# Patient Record
Sex: Female | Born: 1980 | Race: Black or African American | Hispanic: No | Marital: Married | Smoking: Never smoker
Health system: Southern US, Community
[De-identification: ages and names within clinical notes are randomized; demographics above are authoritative.]

## PROBLEM LIST (undated history)

## (undated) DIAGNOSIS — K5900 Constipation, unspecified: Secondary | ICD-10-CM

## (undated) DIAGNOSIS — J310 Chronic rhinitis: Secondary | ICD-10-CM

## (undated) DIAGNOSIS — Z8619 Personal history of other infectious and parasitic diseases: Secondary | ICD-10-CM

## (undated) DIAGNOSIS — Z98891 History of uterine scar from previous surgery: Secondary | ICD-10-CM

## (undated) DIAGNOSIS — N979 Female infertility, unspecified: Secondary | ICD-10-CM

## (undated) DIAGNOSIS — J050 Acute obstructive laryngitis [croup]: Secondary | ICD-10-CM

## (undated) HISTORY — PX: WISDOM TOOTH EXTRACTION: SHX21

## (undated) HISTORY — DX: Personal history of other infectious and parasitic diseases: Z86.19

## (undated) HISTORY — DX: Female infertility, unspecified: N97.9

---
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
## 2001-06-21 ENCOUNTER — Other Ambulatory Visit: Admission: RE | Admit: 2001-06-21 | Discharge: 2001-06-21 | Payer: Self-pay | Admitting: Family Medicine

## 2002-02-11 ENCOUNTER — Encounter: Admission: RE | Admit: 2002-02-11 | Discharge: 2002-02-11 | Payer: Self-pay | Admitting: *Deleted

## 2005-02-25 ENCOUNTER — Emergency Department (HOSPITAL_COMMUNITY): Admission: EM | Admit: 2005-02-25 | Discharge: 2005-02-26 | Payer: Self-pay | Admitting: Emergency Medicine

## 2006-11-17 IMAGING — CT CT PELVIS W/ CM
2 of 5 series · 17 of 46 positions shown, 19 images · IV contrast (APPLIED)
Comparison: None

ABDOMEN CT WITH CONTRAST

CLINICAL DATA: Right lower quadrant pain
TECHNIQUE: Multidetector CT imaging of the abdomen and pelvis was performed
following the standard protocol during bolus administration of intravenous
contrast.

Contrast:  100 cc Omnipaque 300

[Series 2: abd/pelv with 5.0 b31f st · axial · 0.64mm/px · z∈[-464,-64]mm · 14 of 92 slices shown, 16 images]
[im 6/92  soft-tissue]
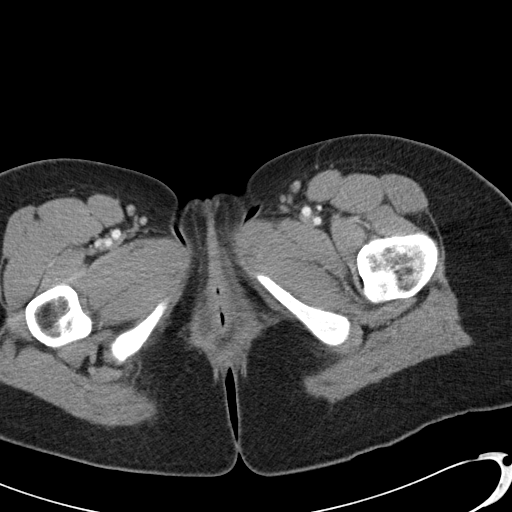
[im 6/92  bone]
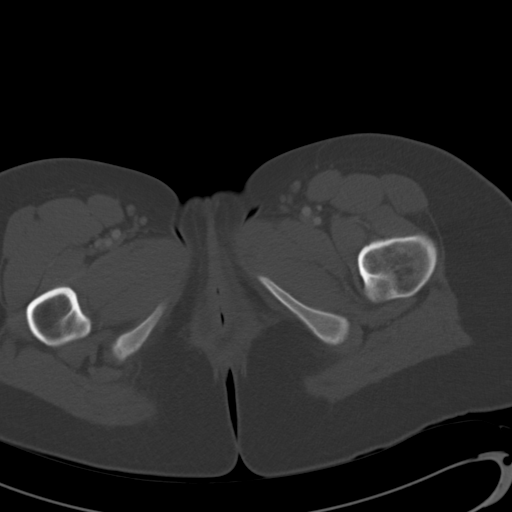
[im 11/92  soft-tissue]
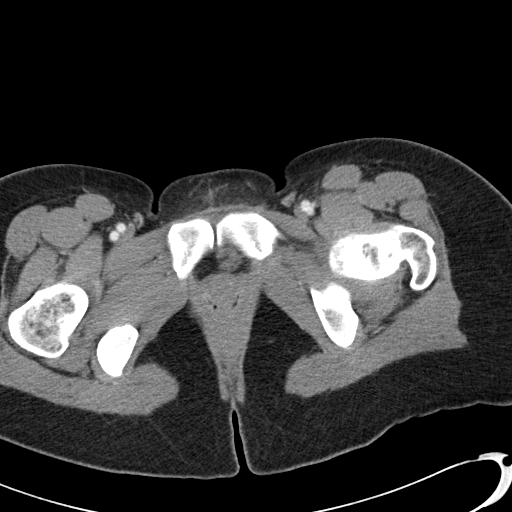
[im 21/92  soft-tissue]
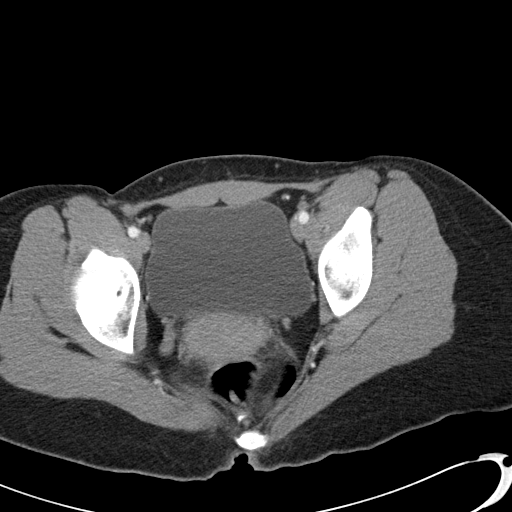
[im 26/92  soft-tissue]
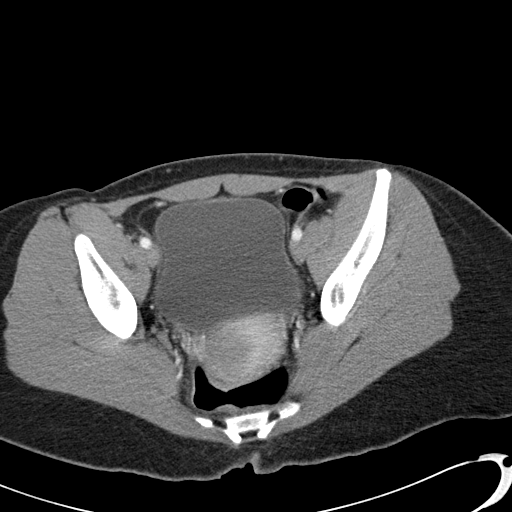
[im 31/92  soft-tissue]
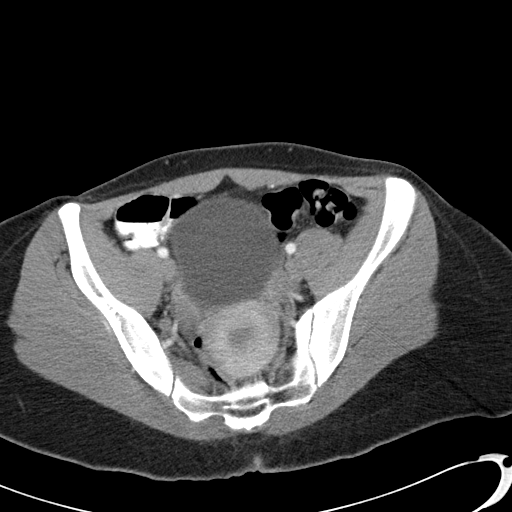
[im 36/92  soft-tissue]
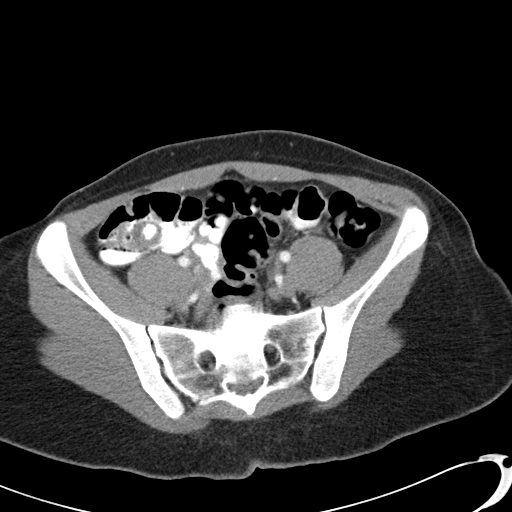
[im 41/92  soft-tissue]
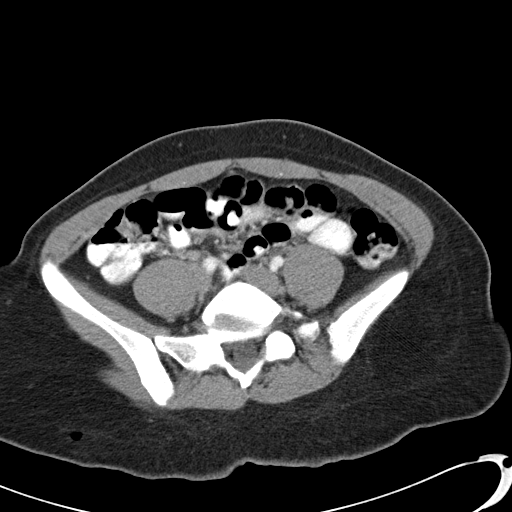
[im 51/92  soft-tissue]
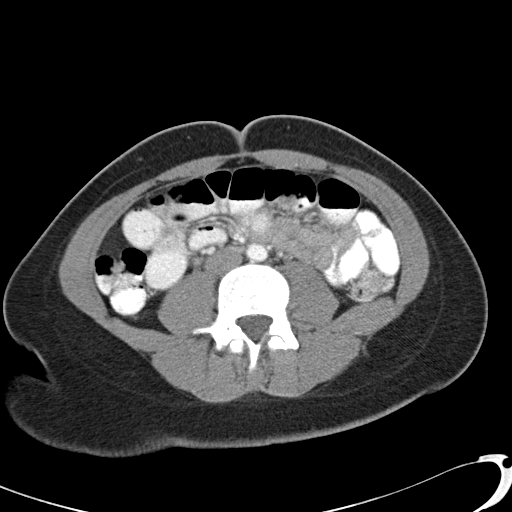
[im 56/92  soft-tissue]
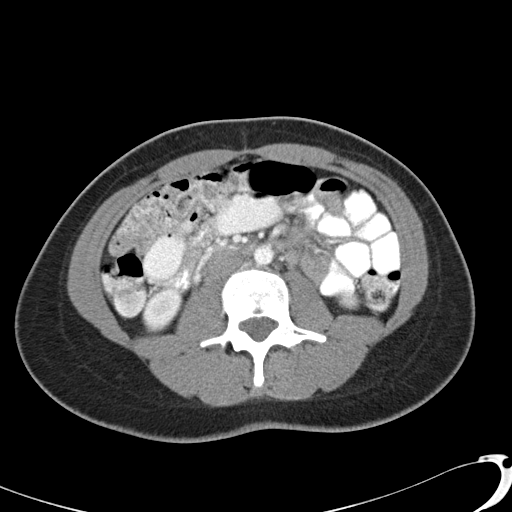
[im 56/92  bone]
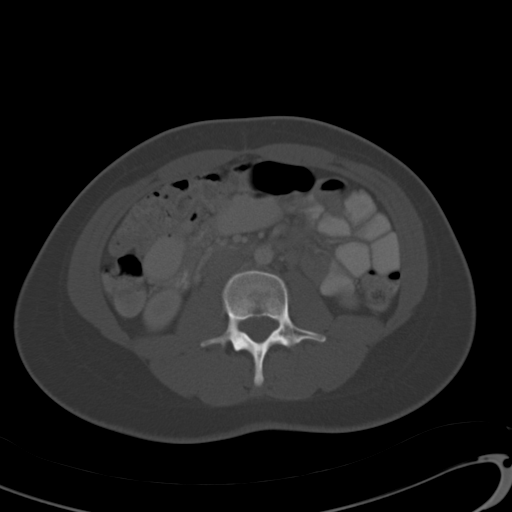
[im 61/92  soft-tissue]
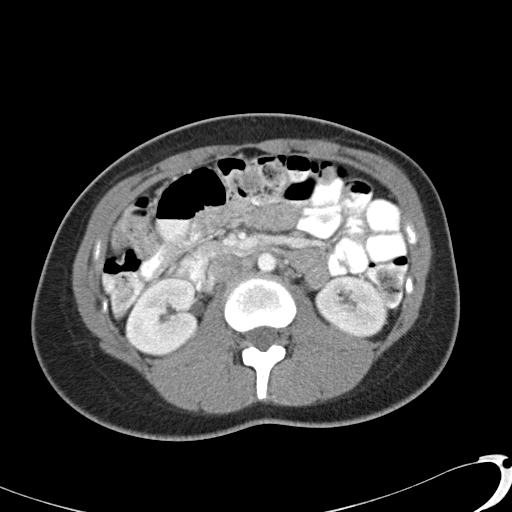
[im 66/92  soft-tissue]
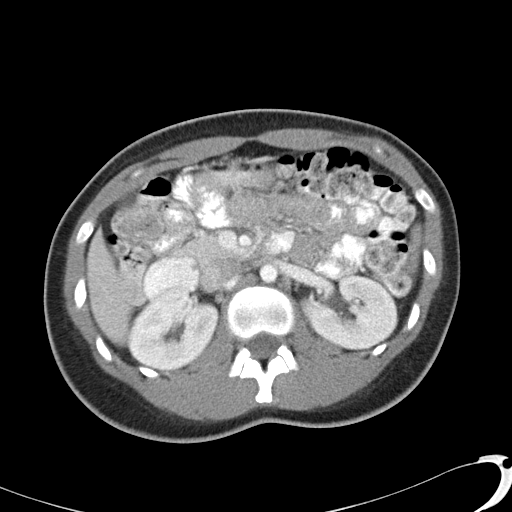
[im 71/92  soft-tissue]
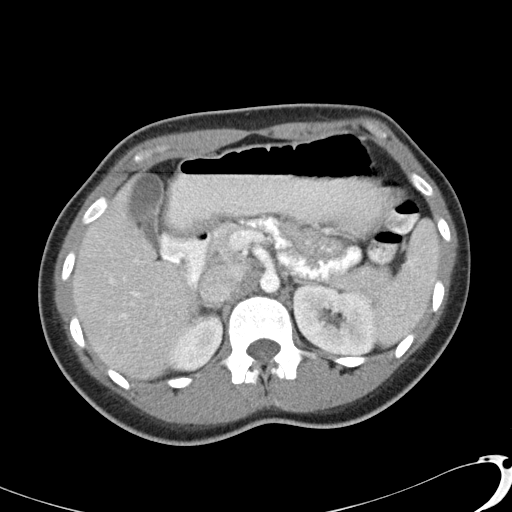
[im 81/92  soft-tissue]
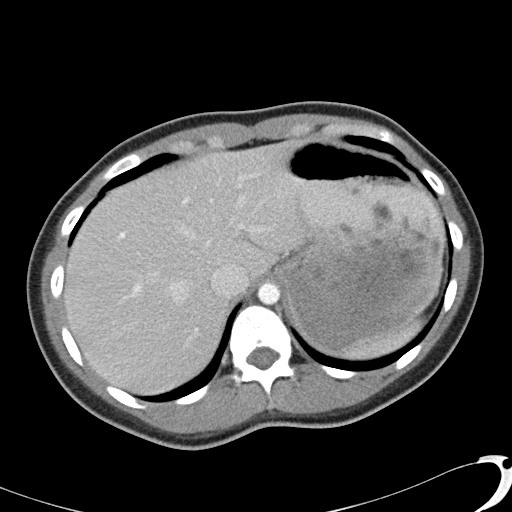
[im 86/92  soft-tissue]
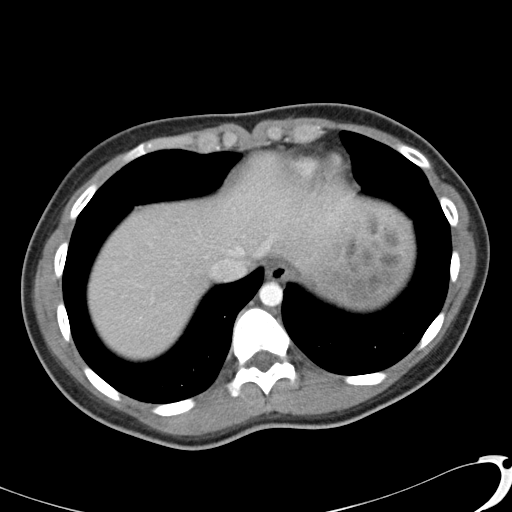

[Series 4: abd/pelv with 2.0 spo st · coronal · 0.89mm/px · 3 of 93 slices shown]
[im 31/93  soft-tissue]
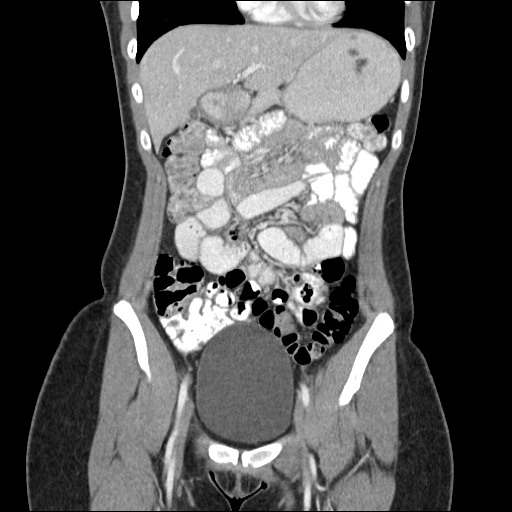
[im 41/93  soft-tissue]
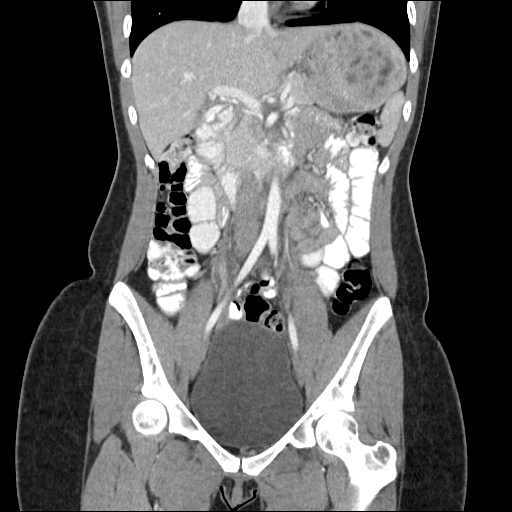
[im 52/93  soft-tissue]
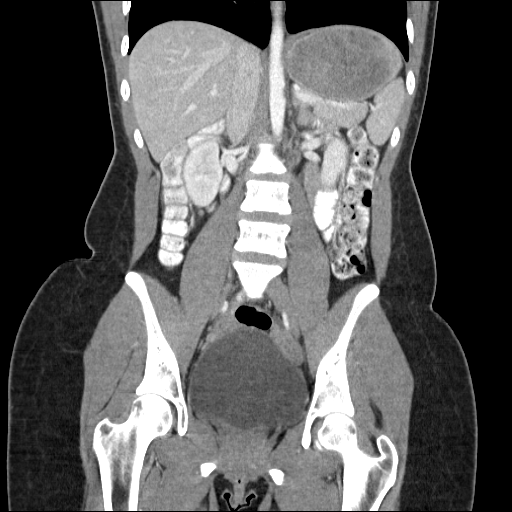

[17 of 46 positions shown; findings below may reference images not displayed]

FINDINGS: Liver, spleen, pancreas, adrenals, kidneys unremarkable. No free
fluid, free air, or adenopathy. Bowel grossly unremarkable.

IMPRESSION

No acute findings.

PELVIS CT WITH CONTRAST
FINDINGS: Appendix is difficult to visualize on the axial images. However, on
the coronal reconstructed images, it appears that this passes medially from the
low lying cecum with the tip lying near the midline. This is filled with
contrast and is normal.

2.1 cm cystic area within the right adnexa, likely right ovarian cyst. Small
amount of free fluid. Small amount of fluid within the endometrium. Left ovary
unremarkable.

IMPRESSION

Appendix difficult to visualize on the axial images, but is seen on the coronal
reconstructed images near the midline and is normal.

2.1 cm right adnexal cyst. Small amount of free fluid.

## 2008-04-02 ENCOUNTER — Encounter: Payer: Self-pay | Admitting: Family Medicine

## 2008-04-02 ENCOUNTER — Ambulatory Visit: Payer: Self-pay | Admitting: Family Medicine

## 2008-04-02 ENCOUNTER — Other Ambulatory Visit: Admission: RE | Admit: 2008-04-02 | Discharge: 2008-04-02 | Payer: Self-pay | Admitting: Family Medicine

## 2008-04-02 DIAGNOSIS — N760 Acute vaginitis: Secondary | ICD-10-CM | POA: Insufficient documentation

## 2008-04-02 LAB — CONVERTED CEMR LAB
Beta hcg, urine, semiquantitative: NEGATIVE
KOH Prep: NEGATIVE

## 2008-04-10 ENCOUNTER — Encounter (INDEPENDENT_AMBULATORY_CARE_PROVIDER_SITE_OTHER): Payer: Self-pay | Admitting: *Deleted

## 2008-05-02 ENCOUNTER — Ambulatory Visit: Payer: Self-pay | Admitting: Family Medicine

## 2008-05-02 DIAGNOSIS — N390 Urinary tract infection, site not specified: Secondary | ICD-10-CM | POA: Insufficient documentation

## 2008-05-02 DIAGNOSIS — J309 Allergic rhinitis, unspecified: Secondary | ICD-10-CM | POA: Insufficient documentation

## 2008-05-02 LAB — CONVERTED CEMR LAB
Bilirubin Urine: NEGATIVE
Glucose, Urine, Semiquant: NEGATIVE
Ketones, urine, test strip: NEGATIVE
Nitrite: NEGATIVE
Protein, U semiquant: NEGATIVE
Specific Gravity, Urine: 1.015
Urobilinogen, UA: 0.2
pH: 6

## 2008-05-03 ENCOUNTER — Encounter: Payer: Self-pay | Admitting: Family Medicine

## 2008-08-12 ENCOUNTER — Ambulatory Visit: Payer: Self-pay | Admitting: Family Medicine

## 2008-08-12 DIAGNOSIS — J019 Acute sinusitis, unspecified: Secondary | ICD-10-CM

## 2008-08-25 ENCOUNTER — Ambulatory Visit: Payer: Self-pay | Admitting: Family Medicine

## 2008-08-25 DIAGNOSIS — N926 Irregular menstruation, unspecified: Secondary | ICD-10-CM

## 2008-08-25 LAB — CONVERTED CEMR LAB: Beta hcg, urine, semiquantitative: NEGATIVE

## 2008-11-14 ENCOUNTER — Ambulatory Visit: Payer: Self-pay | Admitting: Internal Medicine

## 2008-11-28 ENCOUNTER — Encounter (INDEPENDENT_AMBULATORY_CARE_PROVIDER_SITE_OTHER): Payer: Self-pay | Admitting: *Deleted

## 2008-11-28 ENCOUNTER — Ambulatory Visit: Payer: Self-pay | Admitting: Family Medicine

## 2009-04-15 ENCOUNTER — Telehealth (INDEPENDENT_AMBULATORY_CARE_PROVIDER_SITE_OTHER): Payer: Self-pay | Admitting: *Deleted

## 2009-04-20 ENCOUNTER — Telehealth (INDEPENDENT_AMBULATORY_CARE_PROVIDER_SITE_OTHER): Payer: Self-pay | Admitting: *Deleted

## 2009-05-15 ENCOUNTER — Ambulatory Visit: Payer: Self-pay | Admitting: Family

## 2009-05-15 ENCOUNTER — Other Ambulatory Visit: Admission: RE | Admit: 2009-05-15 | Discharge: 2009-05-15 | Payer: Self-pay | Admitting: Family Medicine

## 2009-05-22 ENCOUNTER — Ambulatory Visit: Payer: Self-pay | Admitting: Family

## 2009-05-22 LAB — CONVERTED CEMR LAB
BUN: 6 mg/dL
Basophils Absolute: 0 K/uL
Basophils Relative: 0.6 %
CO2: 26 meq/L
Calcium: 9.2 mg/dL
Chloride: 107 meq/L
Cholesterol: 138 mg/dL
Creatinine, Ser: 0.9 mg/dL
Eosinophils Absolute: 0.1 K/uL
Eosinophils Relative: 1.9 %
GFR calc non Af Amer: 95.82 mL/min
Glucose, Bld: 82 mg/dL
HCT: 35.4 % — ABNORMAL LOW
HDL: 70.1 mg/dL
Hemoglobin: 12.1 g/dL
LDL Cholesterol: 52 mg/dL
Lymphocytes Relative: 37.6 %
Lymphs Abs: 1.8 K/uL
MCHC: 34.1 g/dL
MCV: 99.1 fL
Monocytes Absolute: 0.3 K/uL
Monocytes Relative: 6.4 %
Neutro Abs: 2.7 K/uL
Neutrophils Relative %: 53.5 %
Platelets: 218 K/uL
Potassium: 4 meq/L
RBC: 3.57 M/uL — ABNORMAL LOW
RDW: 11.8 %
Sodium: 139 meq/L
Total CHOL/HDL Ratio: 2
Triglycerides: 78 mg/dL
VLDL: 15.6 mg/dL
WBC: 4.9 10*3/microliter

## 2009-05-29 ENCOUNTER — Encounter: Payer: Self-pay | Admitting: Family

## 2009-06-01 ENCOUNTER — Telehealth: Payer: Self-pay | Admitting: Family

## 2009-06-10 ENCOUNTER — Telehealth (INDEPENDENT_AMBULATORY_CARE_PROVIDER_SITE_OTHER): Payer: Self-pay | Admitting: *Deleted

## 2010-02-23 ENCOUNTER — Ambulatory Visit: Payer: Self-pay | Admitting: Family Medicine

## 2010-02-23 DIAGNOSIS — R5383 Other fatigue: Secondary | ICD-10-CM

## 2010-02-23 DIAGNOSIS — R5381 Other malaise: Secondary | ICD-10-CM

## 2010-02-23 LAB — CONVERTED CEMR LAB: Beta hcg, urine, semiquantitative: NEGATIVE

## 2010-02-24 LAB — CONVERTED CEMR LAB: TSH: 0.88 microintl units/mL (ref 0.35–5.50)

## 2010-03-11 ENCOUNTER — Telehealth (INDEPENDENT_AMBULATORY_CARE_PROVIDER_SITE_OTHER): Payer: Self-pay | Admitting: *Deleted

## 2010-05-25 NOTE — Letter (Signed)
   [facility] [address] [place], [place] [address] [phone]    May 22, 2009   Angela Gill [address] [place], [place] [address]  RE:  LAB RESULTS  Dear  Ms. Gill,  The following is an interpretation of your most recent lab tests.  Please take note of any instructions provided or changes to medications that have resulted from your lab work.  Pap Smear: normal   Sincerely Yours,    Lemont Fillers FNP

## 2010-05-25 NOTE — Progress Notes (Signed)
Summary: STD Results  Phone Note Call from Patient Call back at [phone]   Caller: Patient Call For: Sandford Craze Summary of Call: Patient is requesting results on the STD tests that was ran on the 21st of January. Initial call taken by: Barnie Mort,  June 01, 2009 4:52 PM  Follow-up for Phone Call        called patient and informed her that her G/C chlamydia tests were negative Follow-up by: Lemont Fillers FNP,  June 01, 2009 4:58 PM

## 2010-05-25 NOTE — Assessment & Plan Note (Signed)
Summary: PERIOD FOR 3 WKS/RH.........Marland Kitchen   Vital Signs:  Patient profile:   30 year old female Height:      65.25 inches Weight:      162 pounds BMI:     26.85 Pulse rate:   96 / minute BP sitting:   104 / 66  (right arm)  Vitals Entered By: Doristine Devoid CMA (February 23, 2010 8:53 AM) CC: period for 3 wks was taking atb for uti and has had period since    History of Present Illness: 30 yo woman here today for irregular menses.  started bleeding 1 week ago 10/25.  started w/ spotting, last 3 days have been heavy like a regular period.  LMP 10/15.  was taking abx for UTI on 10/19- Cipro for 3 days.  pt reports increased stress.  stopped pills on 10/28.  thyroid issues- mom w/ hypothyroid, sister recently dx'd w/ hyperthyroid.  was told to have this checked b/c it could be genetic.  also having some fatigue but is having a hard time separating this from normal stress/work issues.  Current Medications (verified): 1)  Sprintec 28 0.25-35 Mg-Mcg Tabs (Norgestimate-Eth Estradiol) .... Take Daily As Directed  Allergies (verified): No Known Drug Allergies  Review of Systems      See HPI  Physical Exam  General:  Young AA female, awake, alert, NAD Neck:  No deformities, masses, or tenderness noted.   Impression & Recommendations:  Problem # 1:  IRREGULAR MENSTRUAL CYCLE (ICD-626.4) Assessment Unchanged  pt's initial spotting was likely due to abx interference w/ hormones.  then when pt stopped the pills she had her period.  advised her to start a new pill pack on Sunday which will be like starting over. Her updated medication list for this problem includes:    Sprintec 28 0.25-35 Mg-mcg Tabs (Norgestimate-eth estradiol) .Marland Kitchen... Take daily as directed  Orders: Urine Pregnancy Test  (29562)  Problem # 2:  FATIGUE (ICD-780.79) Assessment: New family hx of thyroid disorder.  no record of TSH.  will check today. Orders: Venipuncture (13086) TLB-TSH (Thyroid Stimulating Hormone)  (84443-TSH)  Complete Medication List: 1)  Sprintec 28 0.25-35 Mg-mcg Tabs (Norgestimate-eth estradiol) .... Take daily as directed  Patient Instructions: 1)  Schedule your complete physical for January- do not eat before this appt 2)  Throw away the rest of your pill pack and start a new pack on Sunday 3)  We'll notify you of your thyroid results 4)  Call with any questions or concerns 5)  Have a great holiday season!!   Orders Added: 1)  Venipuncture [36415] 2)  TLB-TSH (Thyroid Stimulating Hormone) [84443-TSH] 3)  Urine Pregnancy Test  [81025] 4)  Est. Patient Level III [57846]    Laboratory Results   Urine Tests      Urine HCG: negative

## 2010-05-25 NOTE — Assessment & Plan Note (Signed)
Summary: PAP AND LAB//PH//PT RESCD//CCM   Vital Signs:  Patient profile:   30 year old female Height:      65.25 inches Weight:      158.6 pounds BMI:     26.29 Temp:     98.2 degrees F oral Pulse rate:   76 / minute Resp:     14 per minute BP sitting:   100 / 66  (left arm) Cuff size:   large  Vitals Entered By: Shonna Chock (May 15, 2009 10:46 AM) CC: CPX-? Fasting labs (patient had pears/fruit), pap Comments REVIEWED MED LIST, PATIENT AGREED DOSE AND INSTRUCTION CORRECT   Flu Vaccine Consent Questions     Do you have a history of severe allergic reactions to this vaccine? no    Any prior history of allergic reactions to egg and/or gelatin? no    Do you have a sensitivity to the preservative Thimersol? no    Do you have a past history of Guillan-Barre Syndrome? no    Do you currently have an acute febrile illness? no    Have you ever had a severe reaction to latex? no    Vaccine information given and explained to patient? yes    Are you currently pregnant? no    Lot Number:[id number]   Exp Date:10/22/2009   Site Given  Left Deltoid IM    CC:  CPX-? Fasting labs (patient had pears/fruit) and pap.  History of Present Illness: Angela Gill is a 30 year old female who presents today for a complete physical including PAP.  Needs refill on her OCP.  Preventative- exercises regularly,  eats a healthy diet.  Wants flu shot.  HA's- notes HA's are bad at night.  Notes that she has had history of migraines.  Has noted that headaches are located on the right side of her head, pounding in nature. These HA's last about 5 minutes and resolve on their own.  Has headaches that have been intermittent x 1 month. Patient notes that headache is not the worst headache of their life.    Allergies (verified): No Known Drug Allergies  Family History: CAD-no HTN-no DM-no STROKE-no COLON CA-no BREAST CA-no  Mom- Thyroidectomy Dad-  Healthy 1 sister- alive and well no children  Review  of Systems       Constitutional: Denies Fever ENT:  Denies nasal congestion or sore throat. Resp: Denies cough CV:  Denies Chest Pain or SOB GI:  Denies nausea or vomitting GU: Denies dysuria Lymphatic: Denies lymphadenopathy Musculoskeletal:  Denies muscle/joint pain Skin:  Denies Rashes Psychiatric: Denies depression, + anxiety which she feels is associated with her job Neuro: Denies numbness, weakness or blurred vision    Physical Exam  General:  Young AA female, awake, alert, NAD Head:  Normocephalic and atraumatic without obvious abnormalities. No apparent alopecia or balding. Eyes:  PERRLA Ears:  External ear exam shows no significant lesions or deformities.  Otoscopic examination reveals clear canals, tympanic membranes are intact bilaterally without bulging, retraction, inflammation or discharge. Hearing is grossly normal bilaterally. Mouth:  Oral mucosa and oropharynx without lesions or exudates.  Teeth in good repair. Neck:  No deformities, masses, or tenderness noted. Breasts:  No mass, nodules, thickening, tenderness, bulging, retraction, inflamation, nipple discharge or skin changes noted.   Lungs:  Normal respiratory effort, chest expands symmetrically. Lungs are clear to auscultation, no crackles or wheezes. Heart:  Normal rate and regular rhythm. S1 and S2 normal without gallop, murmur, click, rub or other extra sounds.  Abdomen:  Bowel sounds positive,abdomen soft and non-tender without masses, organomegaly or hernias noted. Genitalia:  Pelvic Exam:        External: normal female genitalia without lesions or masses        Vagina: normal without lesions or masses        Cervix: normal without lesions or masses        Adnexa: normal bimanual exam without masses or fullness        Uterus: normal by palpation        Pap smear: performed Msk:  No deformity or scoliosis noted of thoracic or lumbar spine.   Neurologic:  alert & oriented X3, strength normal in all  extremities, and DTRs symmetrical and normal.   Skin:  Intact without suspicious lesions or rashes Cervical Nodes:  No lymphadenopathy noted Axillary Nodes:  No palpable lymphadenopathy Psych:  Oriented X3.     Impression & Recommendations:  Problem # 1:  Preventive Health Care (ICD-V70.0) Assessment Comment Only Patient maintains a healthy diet and exercise- encouraged patient to continue the good work.  Immunizations reviewed, flu shot today. Plan to have patient return fasting for lab work.   Problem # 2:  CONTRACEPTIVE MANAGEMENT (ICD-V25.09) Assessment: Comment Only Plan refill OCP today, patient is a non-smoker  Problem # 3:  SCREENING FOR MALIGNANT NEOPLASM OF THE CERVIX (ICD-V76.2) Assessment: Comment Only Performed PAP today  Complete Medication List: 1)  Sprintec 28 0.25-35 Mg-mcg Tabs (Norgestimate-eth estradiol) .... Take daily as directed  Other Orders: Admin 1st Vaccine (57846) Flu Vaccine 77yrs + (96295)  Patient Instructions: 1)  Please return fasting for the following lab work- V70 (CBD, BMET, FLP) 2)  Call for your pap smear results if you do not hear from [place] in 2 weeks. 3)  You may try tylenol for your headaches, please call if your headaches become more severe, more frequent or if they are not improved with the use of tylenol. 4)  It was a pleasure to meet you. Prescriptions: SPRINTEC 28 0.25-35 MG-MCG TABS (NORGESTIMATE-ETH ESTRADIOL) take daily as directed  #1 x 11   Entered and Authorized by:   Lemont Fillers FNP   Signed by:   Lemont Fillers FNP on 05/15/2009   Method used:   [facility]  [address] [phone][facility] (retail[facility]       [address]       [place], [place]  [id number]       Ph: [phone] or [phone]       Fax: [phone]   RxID:   [id number]

## 2010-05-25 NOTE — Progress Notes (Signed)
Summary: PHONE  Phone Note Call from Patient Call back at Work Phone [phone]   Caller: Patient Summary of Call: PATIENT STATES THAT HER PERIOD HAS NOT BEEN ON IN A MTH.  PATIENT STATES SHE IS ON BIRTH CONTROL. PATIENT LAST PAP WAS JAN 21,2011. PLEASE ADVISE Initial call taken by: Barb Merino,  June 10, 2009 3:50 PM  Follow-up for Phone Call        pt scheduled appt for monday by Enrique Sack.  LEFT MSG FOR PT KEEP APPT FOR MONDAY WILL NEED TO BE SEEN .Kandice Hams  June 10, 2009 5:00 PM  Follow-up by: Kandice Hams,  June 10, 2009 5:00 PM

## 2010-05-25 NOTE — Letter (Signed)
   [facility] [address] [place], [place] [address] [phone]    May 29, 2009   Angela Gill [address] [place], [place] [address]  RE:  LAB RESULTS  Dear  Ms. Gill,  The following is an interpretation of your most recent lab tests.  Please take note of any instructions provided or changes to medications that have resulted from your lab work.  ELECTROLYTES:  Good - no changes needed  KIDNEY FUNCTION TESTS:  Good - no changes needed  LIPID PANEL:  Good - no changes needed Triglyceride: 78.0   Cholesterol: 138   LDL: 52   HDL: 70.10   Chol/HDL%:  2  DIABETIC STUDIES:  Good - no changes needed Blood Glucose: 82    CBC:  Stable - no changes needed   Sincerely Yours,    Lemont Fillers FNP

## 2010-05-25 NOTE — Progress Notes (Signed)
Summary: mailed thyroid test results  Phone Note Call from Patient Call back at Home Phone [phone]   Caller: Patient Summary of Call: Patient called to ask about thyroid test results--Chemira said OK to tell her results since they were normal--pt asked me to mail them to her--she updated her mailing address from [place] to [place] address ; copied test and mailed Initial call taken by: Jerolyn Shin,  March 11, 2010 4:32 PM

## 2010-05-28 ENCOUNTER — Ambulatory Visit: Payer: Self-pay | Admitting: Family Medicine

## 2010-06-08 ENCOUNTER — Ambulatory Visit (INDEPENDENT_AMBULATORY_CARE_PROVIDER_SITE_OTHER): Payer: BC Managed Care – PPO | Admitting: Family Medicine

## 2010-06-08 ENCOUNTER — Encounter: Payer: Self-pay | Admitting: Family Medicine

## 2010-06-08 DIAGNOSIS — N390 Urinary tract infection, site not specified: Secondary | ICD-10-CM

## 2010-06-08 DIAGNOSIS — J019 Acute sinusitis, unspecified: Secondary | ICD-10-CM

## 2010-06-09 ENCOUNTER — Encounter: Payer: Self-pay | Admitting: Family Medicine

## 2010-06-16 NOTE — Assessment & Plan Note (Signed)
Summary: pressure when urinating/cbs   Vital Signs:  Patient profile:   30 year old female Weight:      159 pounds BMI:     26.35 Pulse rate:   80 / minute BP sitting:   112 / 60  (left arm)  Vitals Entered By: Doristine Devoid CMA (June 08, 2010 9:10 AM) CC: UTI sx pressure    History of Present Illness: Angela Gill here today for ? UTI.  went to UC on Saturday and was dx'd w/ viral URI.  has been taking multiple OTC cold meds.  urine is now discolored, no odor.  was having dysuria- this improved w/ cranberry pills.  + frequency, + urgency, + hesitancy.  URI sxs include nasal congestion, scratchy throat, ear pressure.  no fevers, but having chills.  Current Medications (verified): 1)  Sprintec 28 0.25-35 Mg-Mcg Tabs (Norgestimate-Eth Estradiol) .... Take Daily As Directed  Allergies (verified): No Known Drug Allergies  Review of Systems      See HPI  Physical Exam  General:  Young AA female, awake, alert, NAD Head:  Normocephalic and atraumatic without obvious abnormalities. No apparent alopecia or balding.  + TTP over maxillary sinuses Eyes:  no injxn or inflammation Ears:  External ear exam shows no significant lesions or deformities.  Otoscopic examination reveals clear canals, tympanic membranes are intact bilaterally without bulging, retraction, inflammation or discharge. Hearing is grossly normal bilaterally. Nose:  edematous turbinates Mouth:  Oral mucosa and oropharynx without lesions or exudates.  Teeth in good repair. Neck:  No deformities, masses, or tenderness noted. Lungs:  Normal respiratory effort, chest expands symmetrically. Lungs are clear to auscultation, no crackles or wheezes. Heart:  Normal rate and regular rhythm. S1 and S2 normal without gallop, murmur, click, rub or other extra sounds. Abdomen:  Bowel sounds positive,abdomen soft and non-tender without masses, organomegaly or hernias noted.  no suprapubic or CVA tenderness   Impression &  Recommendations:  Problem # 1:  UTI (ICD-599.0) Assessment Unchanged start bactrim to treat possible UTI.   send urine for cx. Her updated medication list for this problem includes:    Bactrim Ds 800-160 Mg Tabs (Sulfamethoxazole-trimethoprim) .Marland Kitchen... 1 tab by mouth two times a day x3 days  Orders: Specimen Handling ([address]) T-Culture, Urine ([id number]) UA Dipstick w/o Micro (manual) (81002)  Problem # 2:  SINUSITIS - ACUTE-NOS (ICD-461.9) Assessment: Unchanged sxs consistent w/ maxillary sinusitis.  start Bactrim.  reviewed supportive care and red flags that should prompt return.  Pt expresses understanding and is in agreement w/ this plan. Her updated medication list for this problem includes:    Bactrim Ds 800-160 Mg Tabs (Sulfamethoxazole-trimethoprim) .Marland Kitchen... 1 tab by mouth two times a day x3 days  Complete Medication List: 1)  Sprintec 28 0.25-35 Mg-mcg Tabs (Norgestimate-eth estradiol) .... Take daily as directed 2)  Bactrim Ds 800-160 Mg Tabs (Sulfamethoxazole-trimethoprim) .Marland Kitchen.. 1 tab by mouth two times a day x3 days  Patient Instructions: 1)  Take the Bactrim as directed for both a bladder and sinus infection 2)  Drink plenty of fluids 3)  REST! 4)  Hang in there! Prescriptions: BACTRIM DS 800-160 MG TABS (SULFAMETHOXAZOLE-TRIMETHOPRIM) 1 tab by mouth two times a day x3 days  #6 x 0   Entered and Authorized by:   Neena Rhymes MD   Signed by:   Neena Rhymes MD on 06/08/2010   Method used:   [facility]        [facility] [phone]* (retail)  [address]       [place], [place]  [address]       Ph: [phone]       Fax: [phone]   RxID:   [id number]    Orders Added: 1)  Specimen Handling [99000] 2)  T-Culture, Urine [[id number]] 3)  UA Dipstick w/o Micro (manual) [81002] 4)  Est. Patient Level III [27253]  Appended Document: pressure when urinating/cbs    Clinical Lists Changes  Observations: Added  new observation of PH URINE: 6.5  (06/09/2010 14:16) Added new observation of SPEC GR URIN: 1.015  (06/09/2010 14:16) Added new observation of WBC DIPSTK U: small  (06/09/2010 14:16) Added new observation of NITRITE URN: negative  (06/09/2010 14:16) Added new observation of UROBILINOGEN: 0.2  (06/09/2010 14:16) Added new observation of PROTEIN, URN: negative  (06/09/2010 14:16) Added new observation of BLOOD UR DIP: small  (06/09/2010 14:16) Added new observation of KETONES URN: negative  (06/09/2010 14:16) Added new observation of BILIRUBIN UR: negative  (06/09/2010 14:16) Added new observation of GLUCOSE, URN: negative  (06/09/2010 14:16)      Laboratory Results   Urine Tests    Routine Urinalysis   Glucose: negative   (Normal Range: Negative) Bilirubin: negative   (Normal Range: Negative) Ketone: negative   (Normal Range: Negative) Spec. Gravity: 1.015   (Normal Range: 1.003-1.035) Blood: small   (Normal Range: Negative) pH: 6.5   (Normal Range: 5.0-8.0) Protein: negative   (Normal Range: Negative) Urobilinogen: 0.2   (Normal Range: 0-1) Nitrite: negative   (Normal Range: Negative) Leukocyte Esterace: small   (Normal Range: Negative)

## 2010-07-12 ENCOUNTER — Encounter (INDEPENDENT_AMBULATORY_CARE_PROVIDER_SITE_OTHER): Payer: BC Managed Care – PPO | Admitting: Family Medicine

## 2010-07-12 ENCOUNTER — Encounter: Payer: Self-pay | Admitting: Family Medicine

## 2010-07-12 DIAGNOSIS — Z111 Encounter for screening for respiratory tuberculosis: Secondary | ICD-10-CM

## 2010-07-14 ENCOUNTER — Ambulatory Visit: Payer: BC Managed Care – PPO | Admitting: Family Medicine

## 2010-07-22 NOTE — Assessment & Plan Note (Signed)
Summary: cpx, needs tb test, has paperwork for employer---last cpx = 1...   Vital Signs:  Patient profile:   30 year old female Height:      65.25 inches (165.74 cm) Weight:      163 pounds (74.09 kg) BMI:     27.01 Temp:     98.6 degrees F (37.00 degrees C) oral BP sitting:   90 / 60  (left arm) Cuff size:   regular  Vitals Entered By: Lucious Groves CMA (July 12, 2010 3:37 PM) CC: Possible CPX and TB skin test./kb Is Patient Diabetic? No Pain Assessment Patient in pain? no      Comments Patient is not prepared for pap.   CC:  Possible CPX and TB skin test./kb.  History of Present Illness: 30 yo woman here today for TB test.  is going to work at [employer].  needs form completed.  reports she would prefer to have her CPE after she switches insurance.  no concerns today.  Current Medications (verified): 1)  Sprintec 28 0.25-35 Mg-Mcg Tabs (Norgestimate-Eth Estradiol) .... Take Daily As Directed  Allergies (verified): No Known Drug Allergies  Review of Systems      See HPI  Physical Exam  General:  Young AA female, awake, alert, NAD Psych:  Cognition and judgment appear intact. Alert and cooperative with normal attention span and concentration. No apparent delusions, illusions, hallucinations   Impression & Recommendations:  Problem # 1:  SCREENING EXAMINATION FOR PULMONARY TUBERCULOSIS (ICD-V74.1) Assessment New pt's form completed for employment and TB test placed.  pt will return on Wednesday for read.  Complete Medication List: 1)  Sprintec 28 0.25-35 Mg-mcg Tabs (Norgestimate-eth estradiol) .... Take daily as directed  Other Orders: TB Skin Test [phone]) Admin 1st Vaccine (52841)  Patient Instructions: 1)  Schedule your complete physical when you get your new insurance 2)  Get your TB test read on Wednesday 3)  Call with any questions or concerns 4)  [place]   Orders Added: 1)  TB Skin Test [86580] 2)  Admin 1st Vaccine [90471] 3)  Est.  Patient Level III [32440]   Immunizations Administered:  PPD Skin Test:    Vaccine Type: PPD    Site: right forearm    Mfr: Sanofi Pasteur    Dose: 0.1 ml    Route: ID    Given by: Lucious Groves CMA    Exp. Date: 07/07/2012    Lot #: [id number]   Immunizations Administered:  PPD Skin Test:    Vaccine Type: PPD    Site: right forearm    Mfr: Sanofi Pasteur    Dose: 0.1 ml    Route: ID    Given by: Lucious Groves CMA    Exp. Date: 07/07/2012    Lot #: [id number]   Appended Document: cpx, needs tb test, has paperwork for employer---last cpx = 1...    Clinical Lists Changes  Observations: Added new observation of TB PPDRESULT: negative (07/14/2010 16:25) Added new observation of PPD RESULT: < 5mm (07/14/2010 16:25) Added new observation of TB-PPD RDDTE: 07/14/2010 (07/14/2010 16:25)       PPD Results    Date of reading: 07/14/2010    Results: < 5mm    Interpretation: negative Lucious Groves CMA  July 14, 2010 4:26 PM

## 2010-07-28 ENCOUNTER — Other Ambulatory Visit: Payer: Self-pay | Admitting: Family

## 2010-07-28 NOTE — Telephone Encounter (Signed)
Refill sent to pharmacy.   

## 2010-09-28 ENCOUNTER — Other Ambulatory Visit: Payer: Self-pay | Admitting: Family Medicine

## 2010-09-28 NOTE — Telephone Encounter (Signed)
Pt was made aware in March (per [employer]) that CPX is needed.

## 2010-10-28 ENCOUNTER — Other Ambulatory Visit: Payer: Self-pay | Admitting: Family Medicine

## 2010-10-28 NOTE — Telephone Encounter (Signed)
Refill sent.

## 2011-06-16 ENCOUNTER — Other Ambulatory Visit: Payer: Self-pay | Admitting: Family Medicine

## 2011-06-16 MED ORDER — NORGESTIMATE-ETH ESTRADIOL 0.25-35 MG-MCG PO TABS: 1.0000 | ORAL_TABLET | Freq: Every day | ORAL | Status: DC

## 2011-06-16 NOTE — Telephone Encounter (Signed)
rx sent to pharmacy by e-script for 30 day supply per pt noted last OV 06-08-10 Letter has been mailed to pt address noted in the chart to advise they are overdue for cpe/ov/labs and the pt needs to contact office to set up appt

## 2011-08-02 ENCOUNTER — Ambulatory Visit (INDEPENDENT_AMBULATORY_CARE_PROVIDER_SITE_OTHER): Payer: BC Managed Care – PPO | Admitting: Family Medicine

## 2011-08-02 ENCOUNTER — Encounter: Payer: Self-pay | Admitting: Family Medicine

## 2011-08-02 ENCOUNTER — Other Ambulatory Visit (HOSPITAL_COMMUNITY)
Admission: RE | Admit: 2011-08-02 | Discharge: 2011-08-02 | Disposition: A | Discharge status: Home / Self Care | Payer: BC Managed Care – PPO | Source: Ambulatory Visit | Attending: Family Medicine | Admitting: Family Medicine

## 2011-08-02 VITALS — BP 120/80 | HR 74 | Temp 99.0°F | Ht 65.0 in | Wt 152.2 lb

## 2011-08-02 DIAGNOSIS — Z01419 Encounter for gynecological examination (general) (routine) without abnormal findings: Secondary | ICD-10-CM

## 2011-08-02 DIAGNOSIS — Z124 Encounter for screening for malignant neoplasm of cervix: Secondary | ICD-10-CM | POA: Insufficient documentation

## 2011-08-02 LAB — LIPID PANEL
HDL: 60.5 mg/dL (ref >39.00)
Total CHOL/HDL Ratio: 2

## 2011-08-02 LAB — HEPATIC FUNCTION PANEL
AST: 26 U/L (ref 0–37)
Albumin: 4 g/dL (ref 3.5–5.2)
Total Protein: 7 g/dL (ref 6.0–8.3)

## 2011-08-02 LAB — BASIC METABOLIC PANEL
BUN: 12 mg/dL (ref 6–23)
CO2: 25 mEq/L (ref 19–32)
Calcium: 8.9 mg/dL (ref 8.4–10.5)
GFR: 100.8 mL/min (ref >60.00)
Glucose, Bld: 80 mg/dL (ref 70–99)
Potassium: 3.6 mEq/L (ref 3.5–5.1)

## 2011-08-02 LAB — CBC WITH DIFFERENTIAL/PLATELET
Basophils Relative: 0.6 % (ref 0.0–3.0)
Eosinophils Relative: 1 % (ref 0.0–5.0)
HCT: 34.1 % — ABNORMAL LOW (ref 36.0–46.0)
Lymphs Abs: 2.3 10*3/uL (ref 0.7–4.0)
MCV: 97.9 fl (ref 78.0–100.0)
Monocytes Absolute: 0.3 10*3/uL (ref 0.1–1.0)
Monocytes Relative: 6.4 % (ref 3.0–12.0)
RBC: 3.48 Mil/uL — ABNORMAL LOW (ref 3.87–5.11)
WBC: 5.5 10*3/uL (ref 4.5–10.5)

## 2011-08-02 LAB — TSH: TSH: 0.56 u[IU]/mL (ref 0.35–5.50)

## 2011-08-02 MED ORDER — NORGESTIMATE-ETH ESTRADIOL 0.25-35 MG-MCG PO TABS: 1.0000 | ORAL_TABLET | Freq: Every day | ORAL | Status: DC

## 2011-08-02 NOTE — Progress Notes (Signed)
  Subjective:    Patient ID: Angela Gill, female    DOB: [birth year], 31 y.o.   MRN: [id number]  HPI CPE- no concerns.   Review of Systems Patient reports no vision/ hearing changes, adenopathy,fever, weight change,  persistant/recurrent hoarseness , swallowing issues, chest pain, palpitations, edema, persistant/recurrent cough, hemoptysis, dyspnea (rest/exertional/paroxysmal nocturnal), gastrointestinal bleeding (melena, rectal bleeding), abdominal pain, significant heartburn, bowel changes, GU symptoms (dysuria, hematuria, incontinence), Gyn symptoms (abnormal  bleeding, pain),  syncope, focal weakness, memory loss, numbness & tingling, skin/hair/nail changes, abnormal bruising or bleeding, anxiety, or depression.     Objective:   Physical Exam  General Appearance:    Alert, cooperative, no distress, appears stated age  Head:    Normocephalic, without obvious abnormality, atraumatic  Eyes:    PERRL, conjunctiva/corneas clear, EOM's intact, fundi    benign, both eyes  Ears:    Normal TM's and external ear canals, both ears  Nose:   Nares normal, septum midline, mucosa normal, no drainage    or sinus tenderness  Throat:   Lips, mucosa, and tongue normal; teeth and gums normal  Neck:   Supple, symmetrical, trachea midline, no adenopathy;    Thyroid: no enlargement/tenderness/nodules  Back:     Symmetric, no curvature, ROM normal, no CVA tenderness  Lungs:     Clear to auscultation bilaterally, respirations unlabored  Chest Wall:    No tenderness or deformity   Heart:    Regular rate and rhythm, S1 and S2 normal, no murmur, rub   or gallop  Breast Exam:    No tenderness, masses, or nipple abnormality  Abdomen:     Soft, non-tender, bowel sounds active all four quadrants,    no masses, no organomegaly  Genitalia:    External genitalia normal, cervix normal in appearance, no CMT, uterus in normal size and position, adnexa w/out mass or tenderness, mucosa pink and moist, no lesions or  discharge present  Rectal:    Normal external appearance  Extremities:   Extremities normal, atraumatic, no cyanosis or edema  Pulses:   2+ and symmetric all extremities  Skin:   Skin color, texture, turgor normal, no rashes or lesions  Lymph nodes:   Cervical, supraclavicular, and axillary nodes normal  Neurologic:   CNII-XII intact, normal strength, sensation and reflexes    throughout          Assessment & Plan:

## 2011-08-02 NOTE — Assessment & Plan Note (Signed)
Pap collected. 

## 2011-08-02 NOTE — Patient Instructions (Signed)
You look great!  Keep up the good work! We'll notify you of your lab results Skip your last week of pills (the fake ones) and go right into your new pack Call with any questions or concerns Enjoy your day!!!

## 2011-08-02 NOTE — Assessment & Plan Note (Signed)
Pt's PE WNL.  Check labs.  Anticipatory guidance provided.  

## 2011-08-03 ENCOUNTER — Encounter: Payer: Self-pay | Admitting: *Deleted

## 2011-08-05 ENCOUNTER — Encounter: Payer: Self-pay | Admitting: *Deleted

## 2011-08-06 LAB — VITAMIN D 1,25 DIHYDROXY
Vitamin D 1, 25 (OH)2 Total: 45 pg/mL (ref 18–72)
Vitamin D2 1, 25 (OH)2: <8 pg/mL

## 2011-08-08 ENCOUNTER — Encounter: Payer: Self-pay | Admitting: *Deleted

## 2011-09-02 ENCOUNTER — Other Ambulatory Visit: Payer: Self-pay | Admitting: Family Medicine

## 2011-09-02 MED ORDER — NORGESTIMATE-ETH ESTRADIOL 0.25-35 MG-MCG PO TABS: 1.0000 | ORAL_TABLET | Freq: Every day | ORAL | Status: DC

## 2011-09-02 NOTE — Telephone Encounter (Signed)
rx sent to pharmacy by e-script  

## 2011-09-02 NOTE — Telephone Encounter (Signed)
Refill for Sprintec 28 Tablet 0.25-35 MG-MCG Requesting 90 day supply  Last written 4.9.13, qty 28 Take 1-tablet by mouth daily Last OV 4.9.13

## 2011-10-20 ENCOUNTER — Ambulatory Visit: Payer: BC Managed Care – PPO | Admitting: Family Medicine

## 2011-10-20 ENCOUNTER — Emergency Department (HOSPITAL_COMMUNITY): Payer: BC Managed Care – PPO

## 2011-10-20 ENCOUNTER — Encounter (HOSPITAL_COMMUNITY): Payer: Self-pay | Admitting: Nurse Practitioner

## 2011-10-20 ENCOUNTER — Other Ambulatory Visit: Payer: Self-pay

## 2011-10-20 ENCOUNTER — Emergency Department (HOSPITAL_COMMUNITY)
Admission: EM | Admit: 2011-10-20 | Discharge: 2011-10-20 | Disposition: A | Discharge status: Home / Self Care | Payer: BC Managed Care – PPO | Attending: Emergency Medicine | Admitting: Emergency Medicine

## 2011-10-20 DIAGNOSIS — R079 Chest pain, unspecified: Secondary | ICD-10-CM | POA: Insufficient documentation

## 2011-10-20 LAB — URINALYSIS, ROUTINE W REFLEX MICROSCOPIC
Bilirubin Urine: NEGATIVE
Glucose, UA: NEGATIVE mg/dL
Hgb urine dipstick: NEGATIVE
Ketones, ur: NEGATIVE mg/dL
Protein, ur: NEGATIVE mg/dL

## 2011-10-20 MED ORDER — GI COCKTAIL ~~LOC~~
30.0000 mL | Freq: Once | ORAL | Status: AC
  Administered 2011-10-20: 30 mL via ORAL
  Filled 2011-10-20: qty 30

## 2011-10-20 NOTE — ED Notes (Addendum)
Pt states for past 2 days, when she wakes she has severe "Chest pains like theres air in my chest." states pain eases off each morning but returns intermittently throughout the day. Pt denies cardiac history. A&Ox4, resp e/u

## 2011-10-20 NOTE — ED Provider Notes (Signed)
History     CSN: [id number]  Arrival date & time 10/20/11  1000   First MD Initiated Contact with Patient 10/20/11 1043      Chief Complaint  Patient presents with  . Chest Pain    (Consider location/radiation/quality/duration/timing/severity/associated sxs/prior treatment) HPI Comments: Patient presents with left-sided chest pain that has been intermittent over the last 2 and half days.  Patient noted initially on Tuesday night.  She's noted that it's worse in the morning when she first wakes up.  It then waxes and wanes through the day.  She's not noted any changes with eating she does note that sometimes when she stands up it slightly worse but otherwise has noted no specific inciting or relieving factors.  She has not tried taking any medications for it.  She has no fevers, cough, shortness of breath.  The pain is not pleuritic.  Patient has no nausea or vomiting.  No diaphoresis.  No new leg swelling or leg pain.  Patient did attempt to go to her primary care physician today but when she told them she had chest pain they directed her here to the emergency department which is why she came here.  The history is provided by the patient.    History reviewed. No pertinent past medical history.  History reviewed. No pertinent past surgical history.  History reviewed. No pertinent family history.  History  Substance Use Topics  . Smoking status: Never Smoker   . Smokeless tobacco: Not on file  . Alcohol Use: Yes     occasionally    OB History    Grav Para Term Preterm Abortions TAB SAB Ect Mult Living                  Review of Systems  Constitutional: Negative.  Negative for fever and chills.  HENT: Negative.   Eyes: Negative.  Negative for discharge.  Respiratory: Negative.  Negative for cough and shortness of breath.   Cardiovascular: Positive for chest pain.  Gastrointestinal: Negative.  Negative for nausea, vomiting, abdominal pain and diarrhea.  Genitourinary:  Negative.   Musculoskeletal: Negative.  Negative for back pain.  Skin: Negative.  Negative for color change and rash.  Neurological: Negative.  Negative for syncope and headaches.  Hematological: Negative.  Negative for adenopathy.  Psychiatric/Behavioral: Negative.  Negative for confusion.  All other systems reviewed and are negative.    Allergies  Review of patient's allergies indicates no known allergies.  Home Medications   Current Outpatient Rx  Name Route Sig Dispense Refill  . NORGESTIMATE-ETH ESTRADIOL 0.25-35 MG-MCG PO TABS Oral Take 1 tablet by mouth daily. 84 tablet 1    BP 130/79  Pulse 61  Temp 98.7 F (37.1 C) (Oral)  Resp 11  Ht 5\' 6"  (1.676 m)  Wt 151 lb (68.493 kg)  BMI 24.37 kg/m2  SpO2 100%  LMP 10/12/2011  Physical Exam  Nursing note and vitals reviewed. Constitutional: She is oriented to person, place, and time. She appears well-developed and well-nourished.  Non-toxic appearance. She does not have a sickly appearance.  HENT:  Head: Normocephalic and atraumatic.  Eyes: Conjunctivae, EOM and lids are normal. Pupils are equal, round, and reactive to light. No scleral icterus.  Neck: Trachea normal and normal range of motion. Neck supple.  Cardiovascular: Normal rate, regular rhythm and normal heart sounds.   Pulmonary/Chest: Effort normal and breath sounds normal. No respiratory distress. She has no wheezes. She has no rales.  Mild tenderness to palpation over left upper chest  Abdominal: Soft. Normal appearance. There is no tenderness. There is no rebound, no guarding and no CVA tenderness.  Musculoskeletal: Normal range of motion. She exhibits no edema.  Neurological: She is alert and oriented to person, place, and time. She has normal strength.  Skin: Skin is warm, dry and intact. No rash noted.  Psychiatric: She has a normal mood and affect. Her behavior is normal. Judgment and thought content normal.    ED Course  Procedures (including  critical care time)   Labs Reviewed  POCT PREGNANCY, URINE  URINALYSIS, ROUTINE W REFLEX MICROSCOPIC   No results found.   No diagnosis found.   Date: 10/20/2011  Rate: 69  Rhythm: normal sinus rhythm  QRS Axis: normal  Intervals: normal  ST/T Wave abnormalities: normal  Conduction Disutrbances:none  Narrative Interpretation:   Old EKG Reviewed: none available    MDM  Patient with chest pain of unclear etiology but may be related to GERD.  She did get relief with the GI cocktail where she does not feel the pressure or heat in her chest like she did before.  She is unlikely to have ACS given her young age and lack of risk factors.  This does not seem to fit with pulmonary embolus since the patient's only risk factor is that she's on birth control but she has no shortness of breath, tachycardia, hypoxia, tachypnea or pleuritic nature to her pain.  Patient's EKG is normal.  Her chest x-ray is also normal.  I believe the patient can safely be discharged home with further followup with her primary care physician.        Nat Christen, MD 10/20/11 [phone]

## 2011-10-20 NOTE — ED Notes (Signed)
Pt reports left sided chest pain since yesterday in the AM. Described as tightness and worse with inspiration, also c/o some SOB. Denies nausea/vomiting/diaphoresis with pain.

## 2011-10-20 NOTE — Discharge Instructions (Signed)
Gastroesophageal Reflux Disease, Adult Gastroesophageal reflux disease (GERD) happens when acid from your stomach flows up into the esophagus. When acid comes in contact with the esophagus, the acid causes soreness (inflammation) in the esophagus. Over time, GERD may create small holes (ulcers) in the lining of the esophagus. CAUSES   Increased body weight. This puts pressure on the stomach, making acid rise from the stomach into the esophagus.   Smoking. This increases acid production in the stomach.   Drinking alcohol. This causes decreased pressure in the lower esophageal sphincter (valve or ring of muscle between the esophagus and stomach), allowing acid from the stomach into the esophagus.   Late evening meals and a full stomach. This increases pressure and acid production in the stomach.   A malformed lower esophageal sphincter.  Sometimes, no cause is found. SYMPTOMS   Burning pain in the lower part of the mid-chest behind the breastbone and in the mid-stomach area. This may occur twice a week or more often.   Trouble swallowing.   Sore throat.   Dry cough.   Asthma-like symptoms including chest tightness, shortness of breath, or wheezing.  DIAGNOSIS  Your caregiver may be able to diagnose GERD based on your symptoms. In some cases, X-rays and other tests may be done to check for complications or to check the condition of your stomach and esophagus. TREATMENT  Your caregiver may recommend over-the-counter or prescription medicines to help decrease acid production. Ask your caregiver before starting or adding any new medicines.  HOME CARE INSTRUCTIONS   Change the factors that you can control. Ask your caregiver for guidance concerning weight loss, quitting smoking, and alcohol consumption.   Avoid foods and drinks that make your symptoms worse, such as:   Caffeine or alcoholic drinks.   Chocolate.   Peppermint or mint flavorings.   Garlic and onions.   Spicy foods.     Citrus fruits, such as oranges, lemons, or limes.   Tomato-based foods such as sauce, chili, salsa, and pizza.   Fried and fatty foods.   Avoid lying down for the 3 hours prior to your bedtime or prior to taking a nap.   Eat small, frequent meals instead of large meals.   Wear loose-fitting clothing. Do not wear anything tight around your waist that causes pressure on your stomach.   Raise the head of your bed 6 to 8 inches with wood blocks to help you sleep. Extra pillows will not help.   Only take over-the-counter or prescription medicines for pain, discomfort, or fever as directed by your caregiver.   Do not take aspirin, ibuprofen, or other nonsteroidal anti-inflammatory drugs (NSAIDs).  SEEK IMMEDIATE MEDICAL CARE IF:   You have pain in your arms, neck, jaw, teeth, or back.   Your pain increases or changes in intensity or duration.   You develop nausea, vomiting, or sweating (diaphoresis).   You develop shortness of breath, or you faint.   Your vomit is green, yellow, black, or looks like coffee grounds or blood.   Your stool is red, bloody, or black.  These symptoms could be signs of other problems, such as heart disease, gastric bleeding, or esophageal bleeding. MAKE SURE YOU:   Understand these instructions.   Will watch your condition.   Will get help right away if you are not doing well or get worse.  Document Released: 01/19/2005 Document Revised: 03/31/2011 Document Reviewed: 10/29/2010 [facility] Patient Information 2012 [place], [place].  Chest Pain (Nonspecific) It is often hard to  give a specific diagnosis for the cause of chest pain. There is always a chance that your pain could be related to something serious, such as a heart attack or a blood clot in the lungs. You need to follow up with your caregiver for further evaluation. CAUSES   Heartburn.   Pneumonia or bronchitis.   Anxiety or stress.   Inflammation around your heart (pericarditis) or  lung (pleuritis or pleurisy).   A blood clot in the lung.   A collapsed lung (pneumothorax). It can develop suddenly on its own (spontaneous pneumothorax) or from injury (trauma) to the chest.   Shingles infection (herpes zoster virus).  The chest wall is composed of bones, muscles, and cartilage. Any of these can be the source of the pain.  The bones can be bruised by injury.   The muscles or cartilage can be strained by coughing or overwork.   The cartilage can be affected by inflammation and become sore (costochondritis).  DIAGNOSIS  Lab tests or other studies, such as X-rays, electrocardiography, stress testing, or cardiac imaging, may be needed to find the cause of your pain.  TREATMENT   Treatment depends on what may be causing your chest pain. Treatment may include:   Acid blockers for heartburn.   Anti-inflammatory medicine.   Pain medicine for inflammatory conditions.   Antibiotics if an infection is present.   You may be advised to change lifestyle habits. This includes stopping smoking and avoiding alcohol, caffeine, and chocolate.   You may be advised to keep your head raised (elevated) when sleeping. This reduces the chance of acid going backward from your stomach into your esophagus.   Most of the time, nonspecific chest pain will improve within 2 to 3 days with rest and mild pain medicine.  HOME CARE INSTRUCTIONS   If antibiotics were prescribed, take your antibiotics as directed. Finish them even if you start to feel better.   For the next few days, avoid physical activities that bring on chest pain. Continue physical activities as directed.   Do not smoke.   Avoid drinking alcohol.   Only take over-the-counter or prescription medicine for pain, discomfort, or fever as directed by your caregiver.   Follow your caregiver's suggestions for further testing if your chest pain does not go away.   Keep any follow-up appointments you made. If you do not go to  an appointment, you could develop lasting (chronic) problems with pain. If there is any problem keeping an appointment, you must call to reschedule.  SEEK MEDICAL CARE IF:   You think you are having problems from the medicine you are taking. Read your medicine instructions carefully.   Your chest pain does not go away, even after treatment.   You develop a rash with blisters on your chest.  SEEK IMMEDIATE MEDICAL CARE IF:   You have increased chest pain or pain that spreads to your arm, neck, jaw, back, or abdomen.   You develop shortness of breath, an increasing cough, or you are coughing up blood.   You have severe back or abdominal pain, feel nauseous, or vomit.   You develop severe weakness, fainting, or chills.   You have a fever.  THIS IS AN EMERGENCY. Do not wait to see if the pain will go away. Get medical help at once. Call your local emergency services (911 in [place] Do not drive yourself to the hospital. MAKE SURE YOU:   Understand these instructions.   Will watch your condition.  Will get help right away if you are not doing well or get worse.  Document Released: 01/19/2005 Document Revised: 03/31/2011 Document Reviewed: 11/15/2007 [facility] Patient Information 2012 [place], [place].

## 2012-02-06 ENCOUNTER — Ambulatory Visit (INDEPENDENT_AMBULATORY_CARE_PROVIDER_SITE_OTHER): Payer: BC Managed Care – PPO | Admitting: Internal Medicine

## 2012-02-06 ENCOUNTER — Encounter: Payer: Self-pay | Admitting: Internal Medicine

## 2012-02-06 VITALS — BP 102/68 | HR 67 | Temp 98.3°F | Wt 148.0 lb

## 2012-02-06 DIAGNOSIS — L259 Unspecified contact dermatitis, unspecified cause: Secondary | ICD-10-CM

## 2012-02-06 DIAGNOSIS — L309 Dermatitis, unspecified: Secondary | ICD-10-CM

## 2012-02-06 MED ORDER — HYDROCORTISONE 2.5 % EX CREA: TOPICAL_CREAM | Freq: Two times a day (BID) | CUTANEOUS | Status: DC

## 2012-02-06 MED ORDER — PREDNISONE 10 MG PO TABS: ORAL_TABLET | ORAL | Status: DC

## 2012-02-06 NOTE — Progress Notes (Signed)
  Subjective:    Patient ID: Angela Gill, female    DOB: [birth year], 31 y.o.   MRN: [id number]  HPI Acute visit Went to [facility] last week, had ant bites, the left foot looks back to normal, the right foot looks about the same, is still itching, the lesions are slightly harder.  Past medical history and past surgical history essentially negative  Review of Systems     Objective:   Physical Exam Alert oriented x3, no apparent distress. Left foot normal Right foot with no edema, she does have approximately 3 lesions around the base of the great and second toes. They are papular, skin-colored, indeed they are slightly hard. There is no redness or actual swelling.     Assessment & Plan:   Dermatitis likely due to an allergic reaction to ant bites (?), prescribed prednisone by mouth + topical. She wonders about antibiotics, at this point I really don't see an infection, I encouraged her to call me if she is not better after treatment. (not on BCP, on her LMP now)

## 2012-02-06 NOTE — Patient Instructions (Addendum)
prednisone as prescribed starting tomorrow AM  Use the cream twice a day until you feel better If not completely well in 2 weeks let us know.

## 2012-02-07 ENCOUNTER — Encounter: Payer: Self-pay | Admitting: Internal Medicine

## 2012-03-01 ENCOUNTER — Encounter: Payer: Self-pay | Admitting: General Practice

## 2012-03-01 ENCOUNTER — Ambulatory Visit (INDEPENDENT_AMBULATORY_CARE_PROVIDER_SITE_OTHER): Payer: BC Managed Care – PPO | Admitting: Family Medicine

## 2012-03-01 ENCOUNTER — Encounter: Payer: Self-pay | Admitting: Family Medicine

## 2012-03-01 VITALS — BP 122/76 | HR 72 | Temp 98.1°F | Resp 16 | Wt 149.1 lb

## 2012-03-01 DIAGNOSIS — J050 Acute obstructive laryngitis [croup]: Secondary | ICD-10-CM

## 2012-03-01 DIAGNOSIS — J029 Acute pharyngitis, unspecified: Secondary | ICD-10-CM

## 2012-03-01 MED ORDER — PREDNISONE 10 MG PO TABS: ORAL_TABLET | ORAL | Status: DC

## 2012-03-01 MED ORDER — GUAIFENESIN-CODEINE 100-10 MG/5ML PO SYRP: 10.0000 mL | ORAL_SOLUTION | Freq: Three times a day (TID) | ORAL | Status: DC | PRN

## 2012-03-01 NOTE — Patient Instructions (Addendum)
This is croup (parainfluenza virus) Start the Prednisone as directed- take w/ food Use the cough syrup as needed- will make you tired Add Mucinex to thin your congestion Plenty of fluids REST! Hang in there!

## 2012-03-01 NOTE — Progress Notes (Signed)
  Subjective:    Patient ID: Angela Gill, female    DOB: Nov 16, 1980, 31 y.o.   MRN: [id number]  HPI 'sick'- sxs started Monday w/ sore throat, hoarseness.  + runny nose, sneezing, cough- productive of yellow sputum.  No fevers.  No ear pain.  No facial pain/pressure.  + sick contacts.   Review of Systems For ROS see HPI     Objective:   Physical Exam  Constitutional: She appears well-developed and well-nourished. No distress.  HENT:  Head: Normocephalic and atraumatic.       TMs normal bilaterally Mild nasal congestion Throat w/out erythema, edema, or exudate  Eyes: Conjunctivae normal and EOM are normal. Pupils are equal, round, and reactive to light.  Neck: Normal range of motion. Neck supple.  Cardiovascular: Normal rate, regular rhythm, normal heart sounds and intact distal pulses.   No murmur heard. Pulmonary/Chest: Effort normal and breath sounds normal. No respiratory distress. She has no wheezes.       + hacking/barking cough  Lymphadenopathy:    She has no cervical adenopathy.          Assessment & Plan:

## 2012-03-04 NOTE — Assessment & Plan Note (Signed)
New.  Pt's sxs and PE consistent w/ viral croup.  Start steroids, cough syrup prn.  Reviewed supportive care and red flags that should prompt return.  Pt expressed understanding and is in agreement w/ plan.

## 2012-03-14 ENCOUNTER — Telehealth: Payer: Self-pay | Admitting: Family Medicine

## 2012-03-14 DIAGNOSIS — N979 Female infertility, unspecified: Secondary | ICD-10-CM

## 2012-03-14 NOTE — Telephone Encounter (Signed)
Message on Pt phone states that she is unable to accept calls. Call husband and left message for Pt to return call to office.

## 2012-03-14 NOTE — Telephone Encounter (Signed)
If pt has GYN, they will refer to fertility specialist. If no GYN, can refer to [facility] or [facility]

## 2012-03-14 NOTE — Telephone Encounter (Signed)
pt wants referral to Fertility specialist--stated she spoke wt/Tabori about this was advised to come off Birth Control. Has been off for now--with no luck cb# [phone]

## 2012-03-15 NOTE — Telephone Encounter (Signed)
Referral ordered

## 2012-03-15 NOTE — Telephone Encounter (Signed)
pt called stated dr.tabori is her GYN and she will use [facility] ph# [phone]

## 2012-03-16 NOTE — Telephone Encounter (Signed)
Pt was informed by scheduler Latoya.Pt return call and was advise that referral has been started and given the info to [facility].

## 2012-05-08 ENCOUNTER — Encounter: Payer: Self-pay | Admitting: Lab

## 2012-05-09 ENCOUNTER — Encounter: Payer: Self-pay | Admitting: Family Medicine

## 2012-05-09 ENCOUNTER — Ambulatory Visit (INDEPENDENT_AMBULATORY_CARE_PROVIDER_SITE_OTHER): Payer: BC Managed Care – PPO | Admitting: Family Medicine

## 2012-05-09 VITALS — BP 102/72 | HR 66 | Temp 98.2°F | Ht 64.75 in | Wt 150.0 lb

## 2012-05-09 DIAGNOSIS — K5901 Slow transit constipation: Secondary | ICD-10-CM

## 2012-05-09 DIAGNOSIS — N760 Acute vaginitis: Secondary | ICD-10-CM

## 2012-05-09 NOTE — Patient Instructions (Addendum)
Start a daily prenatal w/ less iron Add daily stool softener or OTC Miralax to improve bowels We'll notify you of your lab results Call OB GYN to schedule an appt Hang in there!!!  Don't stress!

## 2012-05-09 NOTE — Progress Notes (Signed)
  Subjective:    Patient ID: Angela Gill, female    DOB: 1980/06/26, 32 y.o.   MRN: [id number]  HPI Constipation- started taking prenatal vitamins and developed constipation.  Now having BM every 3-4 days.  'it's just not normal'.  Has stopped prenatals and BMs have started to normalize.  Vaginal discharge- milky d/c since starting prenatal vitamins 3 weeks ago.  Denies itch or odor.   Review of Systems For ROS see HPI     Objective:   Physical Exam  Vitals reviewed. Constitutional: She appears well-developed and well-nourished. No distress.  Abdominal: Soft. Bowel sounds are normal. She exhibits no distension. There is no tenderness. There is no rebound.  Genitourinary:       External genitalia normal Scant vaginal d/c present w/out erythema or odor  Skin: Skin is warm and dry.  Psychiatric: She has a normal mood and affect. Her behavior is normal.          Assessment & Plan:

## 2012-05-24 ENCOUNTER — Encounter: Payer: Self-pay | Admitting: *Deleted

## 2012-05-25 ENCOUNTER — Encounter: Payer: Self-pay | Admitting: *Deleted

## 2012-05-25 ENCOUNTER — Telehealth: Payer: Self-pay | Admitting: *Deleted

## 2012-05-25 NOTE — Telephone Encounter (Signed)
Spoke with the pt regarding recent lab results and note.  Pt understood and agreed.//AB/CMA  Notes Recorded by Sheliah Hatch, MD on 05/10/2012 at 8:36 AM Normal wet prep- no evidence of infxn

## 2012-05-27 DIAGNOSIS — K5901 Slow transit constipation: Secondary | ICD-10-CM | POA: Insufficient documentation

## 2012-05-27 NOTE — Assessment & Plan Note (Signed)
Wet prep collected.  Will hold on tx until results available.  Pt expressed understanding and is in agreement w/ plan.

## 2012-05-27 NOTE — Assessment & Plan Note (Signed)
New.  Apparent reaction to prenatal vitamins.  Pt has already stopped PNV.  Pt to add stool softener, increase fluid intake, and add miralax prn.

## 2012-09-28 LAB — OB RESULTS CONSOLE GC/CHLAMYDIA
CHLAMYDIA, DNA PROBE: NEGATIVE
Gonorrhea: NEGATIVE

## 2012-10-15 LAB — OB RESULTS CONSOLE ANTIBODY SCREEN: ANTIBODY SCREEN: NEGATIVE

## 2012-10-15 LAB — OB RESULTS CONSOLE GBS: GBS: POSITIVE

## 2012-10-15 LAB — OB RESULTS CONSOLE RUBELLA ANTIBODY, IGM: Rubella: UNDETERMINED

## 2012-10-15 LAB — OB RESULTS CONSOLE RPR: RPR: NONREACTIVE

## 2012-10-15 LAB — OB RESULTS CONSOLE ABO/RH: RH TYPE: POSITIVE

## 2012-10-15 LAB — OB RESULTS CONSOLE HEPATITIS B SURFACE ANTIGEN: Hepatitis B Surface Ag: NEGATIVE

## 2012-10-15 LAB — OB RESULTS CONSOLE HIV ANTIBODY (ROUTINE TESTING): HIV: NONREACTIVE

## 2013-04-26 ENCOUNTER — Other Ambulatory Visit: Payer: Self-pay | Admitting: Obstetrics & Gynecology

## 2013-04-27 ENCOUNTER — Inpatient Hospital Stay (HOSPITAL_COMMUNITY): Admission: AD | Admit: 2013-04-27 | Payer: Self-pay | Source: Ambulatory Visit | Admitting: Obstetrics & Gynecology

## 2013-05-01 ENCOUNTER — Telehealth (HOSPITAL_COMMUNITY): Payer: Self-pay | Admitting: *Deleted

## 2013-05-01 ENCOUNTER — Encounter (HOSPITAL_COMMUNITY): Payer: Self-pay | Admitting: *Deleted

## 2013-05-01 NOTE — Telephone Encounter (Signed)
Preadmission screen  

## 2013-05-03 ENCOUNTER — Inpatient Hospital Stay (HOSPITAL_COMMUNITY): Payer: BC Managed Care – PPO | Admitting: Anesthesiology

## 2013-05-03 ENCOUNTER — Encounter (HOSPITAL_COMMUNITY): Payer: BC Managed Care – PPO | Admitting: Anesthesiology

## 2013-05-03 ENCOUNTER — Encounter (HOSPITAL_COMMUNITY)
Admission: RE | Disposition: A | Discharge status: Home / Self Care | Payer: Self-pay | Source: Ambulatory Visit | Attending: Obstetrics & Gynecology

## 2013-05-03 ENCOUNTER — Inpatient Hospital Stay (HOSPITAL_COMMUNITY)
Admission: RE | Admit: 2013-05-03 | Discharge: 2013-05-05 | DRG: 765 | Disposition: A | Discharge status: Home / Self Care | Payer: BC Managed Care – PPO | Source: Ambulatory Visit | Attending: Obstetrics & Gynecology | Admitting: Obstetrics & Gynecology

## 2013-05-03 ENCOUNTER — Encounter (HOSPITAL_COMMUNITY): Payer: Self-pay

## 2013-05-03 VITALS — BP 123/81 | HR 93 | Temp 98.4°F | Resp 20 | Ht 66.0 in | Wt 206.0 lb

## 2013-05-03 DIAGNOSIS — Z22330 Carrier of Group B streptococcus: Secondary | ICD-10-CM

## 2013-05-03 DIAGNOSIS — O9989 Other specified diseases and conditions complicating pregnancy, childbirth and the puerperium: Secondary | ICD-10-CM

## 2013-05-03 DIAGNOSIS — Z98891 History of uterine scar from previous surgery: Secondary | ICD-10-CM

## 2013-05-03 DIAGNOSIS — O9903 Anemia complicating the puerperium: Secondary | ICD-10-CM | POA: Diagnosis not present

## 2013-05-03 DIAGNOSIS — D62 Acute posthemorrhagic anemia: Secondary | ICD-10-CM | POA: Diagnosis not present

## 2013-05-03 DIAGNOSIS — IMO0002 Reserved for concepts with insufficient information to code with codable children: Secondary | ICD-10-CM | POA: Diagnosis present

## 2013-05-03 DIAGNOSIS — O480 Post-term pregnancy: Principal | ICD-10-CM | POA: Diagnosis present

## 2013-05-03 DIAGNOSIS — O99892 Other specified diseases and conditions complicating childbirth: Secondary | ICD-10-CM | POA: Diagnosis present

## 2013-05-03 DIAGNOSIS — Z3490 Encounter for supervision of normal pregnancy, unspecified, unspecified trimester: Secondary | ICD-10-CM

## 2013-05-03 HISTORY — DX: History of uterine scar from previous surgery: Z98.891

## 2013-05-03 HISTORY — DX: Chronic rhinitis: J31.0

## 2013-05-03 HISTORY — DX: Acute obstructive laryngitis (croup): J05.0

## 2013-05-03 HISTORY — DX: Constipation, unspecified: K59.00

## 2013-05-03 LAB — CBC
HCT: 34.2 % — ABNORMAL LOW (ref 36.0–46.0)
Hemoglobin: 11.5 g/dL — ABNORMAL LOW (ref 12.0–15.0)
MCH: 33 pg (ref 26.0–34.0)
MCHC: 33.6 g/dL (ref 30.0–36.0)
MCV: 98.3 fL (ref 78.0–100.0)
Platelets: 216 10*3/uL (ref 150–400)
RBC: 3.48 MIL/uL — ABNORMAL LOW (ref 3.87–5.11)
RDW: 13.5 % (ref 11.5–15.5)
WBC: 10.8 10*3/uL — ABNORMAL HIGH (ref 4.0–10.5)

## 2013-05-03 LAB — TYPE AND SCREEN
ABO/RH(D): A POS
Antibody Screen: NEGATIVE

## 2013-05-03 LAB — ABO/RH: ABO/RH(D): A POS

## 2013-05-03 LAB — RPR: RPR: NONREACTIVE

## 2013-05-03 SURGERY — Surgical Case
Anesthesia: Spinal | Site: Abdomen

## 2013-05-03 MED ORDER — FENTANYL 2.5 MCG/ML BUPIVACAINE 1/10 % EPIDURAL INFUSION (WH - ANES): 14.0000 mL/h | INTRAMUSCULAR | Status: DC | PRN

## 2013-05-03 MED ORDER — LIDOCAINE HCL (PF) 1 % IJ SOLN: 30.0000 mL | INTRAMUSCULAR | Status: DC | PRN

## 2013-05-03 MED ORDER — DIPHENHYDRAMINE HCL 50 MG/ML IJ SOLN: 12.5000 mg | INTRAMUSCULAR | Status: DC | PRN

## 2013-05-03 MED ORDER — ONDANSETRON HCL 4 MG/2ML IJ SOLN: 4.0000 mg | Freq: Four times a day (QID) | INTRAMUSCULAR | Status: DC | PRN

## 2013-05-03 MED ORDER — OXYTOCIN 40 UNITS IN LACTATED RINGERS INFUSION - SIMPLE MED
1.0000 m[IU]/min | INTRAVENOUS | Status: DC
  Administered 2013-05-03: 1 m[IU]/min via INTRAVENOUS

## 2013-05-03 MED ORDER — ACETAMINOPHEN 325 MG PO TABS: 650.0000 mg | ORAL_TABLET | ORAL | Status: DC | PRN

## 2013-05-03 MED ORDER — EPHEDRINE 5 MG/ML INJ: 10.0000 mg | INTRAVENOUS | Status: DC | PRN

## 2013-05-03 MED ORDER — OXYTOCIN 10 UNIT/ML IJ SOLN
INTRAMUSCULAR | Status: AC
  Filled 2013-05-03: qty 4

## 2013-05-03 MED ORDER — PHENYLEPHRINE 8 MG IN D5W 100 ML (0.08MG/ML) PREMIX OPTIME
INJECTION | INTRAVENOUS | Status: AC
  Filled 2013-05-03: qty 100

## 2013-05-03 MED ORDER — TERBUTALINE SULFATE 1 MG/ML IJ SOLN
0.2500 mg | Freq: Once | INTRAMUSCULAR | Status: AC | PRN
  Administered 2013-05-03: 0.25 mg via SUBCUTANEOUS
  Filled 2013-05-03: qty 1

## 2013-05-03 MED ORDER — FLEET ENEMA 7-19 GM/118ML RE ENEM: 1.0000 | ENEMA | Freq: Every day | RECTAL | Status: DC | PRN

## 2013-05-03 MED ORDER — OXYTOCIN 40 UNITS IN LACTATED RINGERS INFUSION - SIMPLE MED
62.5000 mL/h | INTRAVENOUS | Status: DC
  Filled 2013-05-03: qty 1000

## 2013-05-03 MED ORDER — ONDANSETRON HCL 4 MG/2ML IJ SOLN
INTRAMUSCULAR | Status: AC
  Filled 2013-05-03: qty 2

## 2013-05-03 MED ORDER — MORPHINE SULFATE (PF) 0.5 MG/ML IJ SOLN
INTRAMUSCULAR | Status: DC | PRN
  Administered 2013-05-03: .1 mg via INTRATHECAL

## 2013-05-03 MED ORDER — PHENYLEPHRINE 8 MG IN D5W 100 ML (0.08MG/ML) PREMIX OPTIME
INJECTION | INTRAVENOUS | Status: DC | PRN
  Administered 2013-05-03: 60 ug/min via INTRAVENOUS

## 2013-05-03 MED ORDER — PENICILLIN G POTASSIUM 5000000 UNITS IJ SOLR
2.5000 10*6.[IU] | INTRAMUSCULAR | Status: DC
  Administered 2013-05-03 (×2): 2.5 10*6.[IU] via INTRAVENOUS
  Filled 2013-05-03 (×4): qty 2.5

## 2013-05-03 MED ORDER — ONDANSETRON HCL 4 MG/2ML IJ SOLN
INTRAMUSCULAR | Status: DC | PRN
Start: 2013-05-03 — End: 2013-05-04
  Administered 2013-05-03: 4 mg via INTRAVENOUS

## 2013-05-03 MED ORDER — CITRIC ACID-SODIUM CITRATE 334-500 MG/5ML PO SOLN
30.0000 mL | ORAL | Status: DC | PRN
  Administered 2013-05-03: 30 mL via ORAL
  Filled 2013-05-03: qty 15

## 2013-05-03 MED ORDER — TERBUTALINE SULFATE 1 MG/ML IJ SOLN: 0.2500 mg | Freq: Once | INTRAMUSCULAR | Status: AC | PRN

## 2013-05-03 MED ORDER — CEFAZOLIN SODIUM-DEXTROSE 2-3 GM-% IV SOLR
2.0000 g | Freq: Once | INTRAVENOUS | Status: AC
  Administered 2013-05-03: 2 g via INTRAVENOUS
  Filled 2013-05-03: qty 50

## 2013-05-03 MED ORDER — LACTATED RINGERS IV SOLN
INTRAVENOUS | Status: DC
  Administered 2013-05-03 (×4): via INTRAVENOUS

## 2013-05-03 MED ORDER — IBUPROFEN 600 MG PO TABS: 600.0000 mg | ORAL_TABLET | Freq: Four times a day (QID) | ORAL | Status: DC | PRN

## 2013-05-03 MED ORDER — BUTORPHANOL TARTRATE 1 MG/ML IJ SOLN: 1.0000 mg | INTRAMUSCULAR | Status: DC

## 2013-05-03 MED ORDER — BUTORPHANOL TARTRATE 1 MG/ML IJ SOLN: 1.0000 mg | INTRAMUSCULAR | Status: DC | PRN

## 2013-05-03 MED ORDER — BUPIVACAINE IN DEXTROSE 0.75-8.25 % IT SOLN
INTRATHECAL | Status: DC | PRN
  Administered 2013-05-03: 1.5 mg via INTRATHECAL

## 2013-05-03 MED ORDER — PENICILLIN G POTASSIUM 5000000 UNITS IJ SOLR
5.0000 10*6.[IU] | Freq: Once | INTRAVENOUS | Status: AC
  Administered 2013-05-03: 5 10*6.[IU] via INTRAVENOUS
  Filled 2013-05-03: qty 5

## 2013-05-03 MED ORDER — LACTATED RINGERS IV SOLN
500.0000 mL | INTRAVENOUS | Status: DC | PRN
  Administered 2013-05-03: 500 mL via INTRAVENOUS

## 2013-05-03 MED ORDER — FENTANYL CITRATE 0.05 MG/ML IJ SOLN
INTRAMUSCULAR | Status: AC
  Filled 2013-05-03: qty 2

## 2013-05-03 MED ORDER — MISOPROSTOL 25 MCG QUARTER TABLET
25.0000 ug | ORAL_TABLET | ORAL | Status: DC | PRN
  Administered 2013-05-03 (×2): 25 ug via VAGINAL
  Filled 2013-05-03 (×2): qty 0.25

## 2013-05-03 MED ORDER — FENTANYL CITRATE 0.05 MG/ML IJ SOLN
INTRAMUSCULAR | Status: DC | PRN
Start: 2013-05-03 — End: 2013-05-04
  Administered 2013-05-03: 15 ug via INTRATHECAL

## 2013-05-03 MED ORDER — PHENYLEPHRINE 40 MCG/ML (10ML) SYRINGE FOR IV PUSH (FOR BLOOD PRESSURE SUPPORT): 80.0000 ug | PREFILLED_SYRINGE | INTRAVENOUS | Status: DC | PRN

## 2013-05-03 MED ORDER — LACTATED RINGERS IV SOLN: 500.0000 mL | Freq: Once | INTRAVENOUS | Status: DC

## 2013-05-03 MED ORDER — TERBUTALINE SULFATE 1 MG/ML IJ SOLN: 0.2500 mg | Freq: Once | INTRAMUSCULAR | Status: DC

## 2013-05-03 MED ORDER — MORPHINE SULFATE 0.5 MG/ML IJ SOLN
INTRAMUSCULAR | Status: AC
  Filled 2013-05-03: qty 10

## 2013-05-03 MED ORDER — MEPERIDINE HCL 25 MG/ML IJ SOLN
INTRAMUSCULAR | Status: DC | PRN
  Administered 2013-05-03: 25 mg via INTRAVENOUS

## 2013-05-03 MED ORDER — OXYCODONE-ACETAMINOPHEN 5-325 MG PO TABS: 1.0000 | ORAL_TABLET | ORAL | Status: DC | PRN

## 2013-05-03 MED ORDER — MEPERIDINE HCL 25 MG/ML IJ SOLN
INTRAMUSCULAR | Status: AC
  Filled 2013-05-03: qty 1

## 2013-05-03 MED ORDER — OXYTOCIN BOLUS FROM INFUSION: 500.0000 mL | INTRAVENOUS | Status: DC

## 2013-05-03 MED ORDER — OXYTOCIN 10 UNIT/ML IJ SOLN
40.0000 [IU] | INTRAVENOUS | Status: DC | PRN
  Administered 2013-05-03: 40 [IU] via INTRAVENOUS

## 2013-05-03 SURGICAL SUPPLY — 39 items
BENZOIN TINCTURE PRP APPL 2/3 (GAUZE/BANDAGES/DRESSINGS) ×3 IMPLANT
CLAMP CORD UMBIL (MISCELLANEOUS) IMPLANT
CLOSURE WOUND 1/2 X4 (GAUZE/BANDAGES/DRESSINGS) ×1
CLOTH BEACON ORANGE TIMEOUT ST (SAFETY) ×3 IMPLANT
CONTAINER PREFILL 10% NBF 15ML (MISCELLANEOUS) IMPLANT
DRAPE LG THREE QUARTER DISP (DRAPES) IMPLANT
DRSG OPSITE POSTOP 4X10 (GAUZE/BANDAGES/DRESSINGS) ×3 IMPLANT
DURAPREP 26ML APPLICATOR (WOUND CARE) ×3 IMPLANT
ELECT REM PT RETURN 9FT ADLT (ELECTROSURGICAL) ×3
ELECTRODE REM PT RTRN 9FT ADLT (ELECTROSURGICAL) ×1 IMPLANT
EXTRACTOR VACUUM KIWI (MISCELLANEOUS) IMPLANT
EXTRACTOR VACUUM M CUP 4 TUBE (SUCTIONS) IMPLANT
EXTRACTOR VACUUM M CUP 4' TUBE (SUCTIONS)
GLOVE BIO SURGEON STRL SZ7 (GLOVE) ×3 IMPLANT
GLOVE BIOGEL PI IND STRL 7.0 (GLOVE) ×1 IMPLANT
GLOVE BIOGEL PI INDICATOR 7.0 (GLOVE) ×2
GOWN PREVENTION PLUS XLARGE (GOWN DISPOSABLE) ×6 IMPLANT
GOWN STRL REIN XL XLG (GOWN DISPOSABLE) ×6 IMPLANT
KIT ABG SYR 3ML LUER SLIP (SYRINGE) IMPLANT
NEEDLE HYPO 25X5/8 SAFETYGLIDE (NEEDLE) IMPLANT
NS IRRIG 1000ML POUR BTL (IV SOLUTION) ×3 IMPLANT
PACK C SECTION WH (CUSTOM PROCEDURE TRAY) ×3 IMPLANT
PAD OB MATERNITY 4.3X12.25 (PERSONAL CARE ITEMS) ×3 IMPLANT
RTRCTR C-SECT PINK 25CM LRG (MISCELLANEOUS) ×3 IMPLANT
STAPLER VISISTAT 35W (STAPLE) IMPLANT
STRIP CLOSURE SKIN 1/2X4 (GAUZE/BANDAGES/DRESSINGS) ×2 IMPLANT
SUT MON AB-0 CT1 36 (SUTURE) ×9 IMPLANT
SUT PLAIN 0 NONE (SUTURE) IMPLANT
SUT PLAIN 2 0 (SUTURE) ×2
SUT PLAIN ABS 2-0 CT1 27XMFL (SUTURE) ×1 IMPLANT
SUT VIC AB 0 CT1 27 (SUTURE) ×4
SUT VIC AB 0 CT1 27XBRD ANBCTR (SUTURE) ×2 IMPLANT
SUT VIC AB 2-0 CT1 27 (SUTURE) ×4
SUT VIC AB 2-0 CT1 TAPERPNT 27 (SUTURE) ×2 IMPLANT
SUT VIC AB 4-0 KS 27 (SUTURE) ×3 IMPLANT
SUT VICRYL 0 TIES 12 18 (SUTURE) IMPLANT
TOWEL OR 17X24 6PK STRL BLUE (TOWEL DISPOSABLE) ×3 IMPLANT
TRAY FOLEY CATH 14FR (SET/KITS/TRAYS/PACK) IMPLANT
WATER STERILE IRR 1000ML POUR (IV SOLUTION) IMPLANT

## 2013-05-03 NOTE — Anesthesia Procedure Notes (Signed)
Spinal  Patient location during procedure: OR Start time: 05/03/2013 10:46 PM Staffing Performed by: anesthesiologist  Preanesthetic Checklist Completed: patient identified, site marked, surgical consent, pre-op evaluation, timeout performed, IV checked, risks and benefits discussed and monitors and equipment checked Spinal Block Patient position: sitting Prep: site prepped and draped and DuraPrep Patient monitoring: heart rate, cardiac monitor, continuous pulse ox and blood pressure Approach: midline Location: L3-4 Injection technique: single-shot Needle Needle type: Pencan  Needle gauge: 24 G Needle length: 9 cm Assessment Sensory level: T4 Additional Notes Clear free flow CSF on first attempt.  No paresthesia.  Patient tolerated procedure well with no apparent complications.  Jasmine DecemberA. Cassidy, MD

## 2013-05-03 NOTE — H&P (Signed)
Angela KelchVeronica Y Gill is a 33 y.o. female presenting for labor IOL at 40.6 wks due to maternal exhaustion desiring IOL. Good FMs, off and on UCs for weeks, no vag bleeding or leaking fluid.  [facility]ertility with CLOMID pregnancy,prometrium in 1st trim. No complications no GDM but wt gain #52-54 lbs. Last sono at 34 wks, 5'10" at 75%. VTX.    History OB History   Grav Para Term Preterm Abortions TAB SAB Ect Mult Living   1              Past Medical History  Diagnosis Date  . Hx of varicella   . Infertility, female    Past Surgical History  Procedure Laterality Date  . Wisdom tooth extraction     Family History: family history includes Hypothyroidism in her mother and sister. Social History:  reports that she has never smoked. She has never used smokeless tobacco. She reports that she drinks alcohol. She reports that she does not use illicit drugs.   Prenatal Transfer Tool  Maternal Diabetes: No Genetic Screening: Normal Ultrascreen neg, AFP 1 normal Maternal Ultrasounds/Referrals: Normal Fetal Ultrasounds or other Referrals:  [facility] Maternal Substance Abuse:  No Significant Maternal Medications:  None Significant Maternal Lab Results:  Lab values include: Group B Strep positive, Rubella equivocal. S/p TDAp Other Comments:  None  ROS neg (several URIs in preg, none now)    Temperature 98.2 F (36.8 C), temperature source Oral, resp. rate 18, height 5\' 6"  (1.676 m), weight 206 lb (93.441 kg), last menstrual period 04/16/2012. Exam Physical Exam   A&O x 3, no acute distress. Pleasant HEENT neg, no thyromegaly Lungs CTA bilat CV RRR, A1S2 normal Abdo soft, non tender, non acute Extr no edema/ tenderness Pelvic closed/ long cx/ stn high, VTX by bedside sono  FHT 140s/ reactive Toco none  Prenatal labs: ABO, Rh: A/Positive/-- (06/23 0000) Antibody: Negative (06/23 0000) Rubella: Equivocal (06/23 0000) RPR: Nonreactive (06/23 0000)  HBsAg: Negative (06/23  0000)  HIV: Non-reactive (06/23 0000)  GBS: Positive (06/23 0000)  Glucose screen passed Anatomy sono normal   Assessment/Plan: 33 yo G1, 40.6 wks, Clomid preg after infertility, here for IOL due to maternal exhaustion. GBS(+), EFW 8 lbs. Cytotec q 4 hrs, then pitocin once effaced. Pain mngmt options reviewed.    Rodger Giangregorio R 05/03/2013, 8:48 AM

## 2013-05-03 NOTE — Anesthesia Preprocedure Evaluation (Signed)
Anesthesia Evaluation  Patient identified by MRN, date of birth, ID band Patient awake    Reviewed: Allergy & Precautions, H&P , NPO status , Patient's Chart, lab work & pertinent test results, reviewed documented beta blocker date and time   History of Anesthesia Complications Negative for: history of anesthetic complications  Airway Mallampati: I TM Distance: >3 FB Neck ROM: full    Dental  (+) Teeth Intact   Pulmonary neg pulmonary ROS,  breath sounds clear to auscultation        Cardiovascular negative cardio ROS  Rhythm:regular Rate:Normal     Neuro/Psych negative neurological ROS  negative psych ROS   GI/Hepatic negative GI ROS, Neg liver ROS,   Endo/Other  negative endocrine ROS  Renal/GU negative Renal ROS     Musculoskeletal   Abdominal   Peds  Hematology negative hematology ROS (+)   Anesthesia Other Findings   Reproductive/Obstetrics (+) Pregnancy (failure to progress, fetal intolerance --> C/S)                           Anesthesia Physical Anesthesia Plan  ASA: II and emergent  Anesthesia Plan: Spinal   Post-op Pain Management:    Induction:   Airway Management Planned:   Additional Equipment:   Intra-op Plan:   Post-operative Plan:   Informed Consent: I have reviewed the patients History and Physical, chart, labs and discussed the procedure including the risks, benefits and alternatives for the proposed anesthesia with the patient or authorized representative who has indicated his/her understanding and acceptance.     Plan Discussed with: Surgeon and CRNA  Anesthesia Plan Comments:         Anesthesia Quick Evaluation

## 2013-05-03 NOTE — Progress Notes (Signed)
Monitors off pt to OR

## 2013-05-03 NOTE — Progress Notes (Signed)
Angela Gill is a 33 y.o. G1P0 at 2063w6d IOL for dates.   Objective: BP 131/69  Pulse 81  Temp(Src) 98.1 F (36.7 C) (Oral)  Resp 20  Ht 5\' 6"  (1.676 m)  Wt 206 lb (93.441 kg)  BMI 33.27 kg/m2  LMP 04/16/2012     FHT:  FHR: 145 bpm, variability: moderate,  accelerations:  Present,  decelerations:  Present repetitive variable decels with late component UC:   regular, every 2-3 minutes (spaced when pitocin turned off) SVE:   Dilation: Closed Effacement (%): Thick Station: -3 Exam by:: Damion Kant Pitocin max at 2-4 units with fetal intolerance to contraction.  Assessment / Plan: Early labor with IOL but fetal intolerance to labor, remote from delivery  Labor: latent labor Fetal Wellbeing:  Category II with minimal pitocin, tried O2, fluid boluses, terbutaline and stopped UCs resolves fetal decels, returns with contractions.  I/D:  GBS(+) with several doses of PCN Anticipated MOD:   Moving to c-section  Pt offered rest for several hour, O2, fluid again and restart pitocin to assess if labor tolerated, patient does not want to keep trying and stress baby, will move for C/section delivery now.   Risks/complications of surgery reviewed incl infection, bleeding, damage to internal organs including bladder, bowels, ureters, blood vessels, other risks from anesthesia, VTE and delayed complications of any surgery, complications in future surgery reviewed. Also discussed neonatal complications incl difficult delivery, laceration, vacuum assistance, TTN etc. Pt understands and agrees, all concerns addressed.      Faizon Capozzi R 05/03/2013, 10:28 PM

## 2013-05-03 NOTE — Progress Notes (Signed)
c-section consents gone over with pt per Dr. Juliene PinaMOdy

## 2013-05-03 NOTE — Progress Notes (Signed)
Angela Gill is a 33 y.o. G1P0 at 7375w6d IOL for dates. S/p Cytotec x 2. RN called when repetitive variable decels with late component noted with UCs q 1 min at 5.30-5.45 pm, S/p Terbutaline inj x 1 dose resolved UCs and FHT recovered back to category I.   BP 116/59  Pulse 97  Temp(Src) 98.1 F (36.7 C) (Oral)  Resp 18  Ht 5\' 6"  (1.676 m)  Wt 206 lb (93.441 kg)  BMI 33.27 kg/m2  LMP 04/16/2012   FHT: 140-145/ + accels/ no decels/ category I UC:  q 3-5 min, spaced out since terbutaline SVE:   Dilation: Closed Effacement (%): Thick Station: -3 Exam by:: Eliabeth Shoff High station  Assessment / Plan: IOL, postterm. S/p Cytotecx2, start pitocin and assess labor. FHT I now (was II for few minutes before terb) Vtx still floating but at inlet. GBS coverage with PCN per protocol started in AM. Assess progress, esp descent.   Tajana Crotteau R 05/03/2013, 7:13 PM

## 2013-05-03 NOTE — Op Note (Signed)
Cesarean Section Procedure Note 05/03/2013 Angela Gill   Indications: Fetal intolerance to labor, remote from delivery   Surgery: Primary Low Transverse Cesarean section  Pre-operative Diagnosis: Labor Induction for dates at 41 wks, Fetal Intolerance to Labor.   Post-operative Diagnosis: Same   Surgeon: Robley FriesVaishali R Rolan Wrightsman, MD    Assistants: None  Anesthesia: spinal   Procedure Details:  The patient was seen in the Labor Room. After several attempts to restart pitocin for labor induction and fetal intolerance to contractions, patient not dilated past finger tip and no fetal descent noted, decision was made to proceed with cesarean delivery. The risks, benefits, complications, treatment options, and expected outcomes were discussed with the patient. The patient concurred with the proposed plan, giving informed consent. identified as Angela Gill and the procedure verified as C-Section Delivery. She was brought to the Operating Room. A Time Out was held and the above information confirmed. After induction of Spinal anesthesia, the patient was prepped and draped in the usual sterile manner. A Pfannenstiel Incision was made and carried down through the subcutaneous tissue to the fascia. Fascial incision was made and extended transversely. The fascia was separated from the underlying rectus tissue superiorly and inferiorly. The peritoneum was identified and entered. Peritoneal incision was extended longitudinally. Alexis-O retractor was placed securely. The utero-vesical peritoneal reflection was incised transversely and the bladder flap was bluntly freed from the lower uterine segment.  A low transverse uterine incision was made. Amniotic fluid was clear but minimal. Delivered from cephalic presentation was a vigorous Living FEMALE newborn infant at 11.07 on 05/03/2013  with Apgar scores of 9 at one minute and 9 at five minutes. Cord ph was not sent the umbilical cord was clamped and cut cord blood was  obtained for evaluation. The placenta was removed Intact and appeared normal. The uterine outline, tubes and ovaries appeared normal. The uterine incision was closed with running locked sutures of 0 Vicryl followed by a second imbricating layer.  Hemostasis was observed. Alexis-O retractor was removed. Parietal peritoneum was closed with 2-0 Vicryl. The fascia was then reapproximated with running sutures of 0Vicryl. The subcutaneous fat closure was performed using 2-0plain gut. The skin was closed with 4-0Vicryl. Steristrips and dressing applied.   Instrument, sponge, and needle counts were correct prior the abdominal closure and were correct at the conclusion of the case.   Findings: FEMALE infant delivered Cephalic presentation at 11.07 pm. Nuchal and body cord reduced at birth. Apgars 9 and 9 at 1 and 5 minutes. Weight-pending. Ovaries and tubes normal. Uterus normal, no extensions.    Estimated Blood Loss: 600 cc  Total IV Fluids:  2500 cc LR  Urine Output: 600CC OF clear urine  Specimens: Cord blood  Complications: no complications  Disposition: PACU - hemodynamically stable.   Maternal Condition: stable   Baby condition / location:  Couplet care / Skin to Skin  Attending Attestation: I performed the procedure.   Signed: Surgeon(s): Robley FriesVaishali R Zelma Mazariego, MD

## 2013-05-04 LAB — CBC
HCT: 27.7 % — ABNORMAL LOW (ref 36.0–46.0)
Hemoglobin: 9.2 g/dL — ABNORMAL LOW (ref 12.0–15.0)
MCH: 33.3 pg (ref 26.0–34.0)
MCHC: 33.9 g/dL (ref 30.0–36.0)
MCV: 98.2 fL (ref 78.0–100.0)
PLATELETS: 174 10*3/uL (ref 150–400)
RBC: 2.82 MIL/uL — ABNORMAL LOW (ref 3.87–5.11)
RDW: 13.3 % (ref 11.5–15.5)
WBC: 15.5 10*3/uL — ABNORMAL HIGH (ref 4.0–10.5)

## 2013-05-04 MED ORDER — FENTANYL CITRATE 0.05 MG/ML IJ SOLN
INTRAMUSCULAR | Status: AC
  Filled 2013-05-04: qty 2

## 2013-05-04 MED ORDER — DIPHENHYDRAMINE HCL 25 MG PO CAPS: 25.0000 mg | ORAL_CAPSULE | Freq: Four times a day (QID) | ORAL | Status: DC | PRN

## 2013-05-04 MED ORDER — SCOPOLAMINE 1 MG/3DAYS TD PT72
MEDICATED_PATCH | TRANSDERMAL | Status: AC
  Filled 2013-05-04: qty 1

## 2013-05-04 MED ORDER — MEPERIDINE HCL 25 MG/ML IJ SOLN: 6.2500 mg | INTRAMUSCULAR | Status: DC | PRN

## 2013-05-04 MED ORDER — NALOXONE HCL 0.4 MG/ML IJ SOLN: 0.4000 mg | INTRAMUSCULAR | Status: DC | PRN

## 2013-05-04 MED ORDER — FENTANYL CITRATE 0.05 MG/ML IJ SOLN
INTRAMUSCULAR | Status: AC
  Administered 2013-05-04: 50 ug via INTRAVENOUS
  Filled 2013-05-04: qty 2

## 2013-05-04 MED ORDER — DIBUCAINE 1 % RE OINT: 1.0000 "application " | TOPICAL_OINTMENT | RECTAL | Status: DC | PRN

## 2013-05-04 MED ORDER — MENTHOL 3 MG MT LOZG: 1.0000 | LOZENGE | OROMUCOSAL | Status: DC | PRN

## 2013-05-04 MED ORDER — LACTATED RINGERS IV SOLN
INTRAVENOUS | Status: DC
Start: 2013-05-04 — End: 2013-05-04
  Administered 2013-05-04: 03:00:00 via INTRAVENOUS

## 2013-05-04 MED ORDER — ONDANSETRON HCL 4 MG PO TABS: 4.0000 mg | ORAL_TABLET | ORAL | Status: DC | PRN

## 2013-05-04 MED ORDER — SCOPOLAMINE 1 MG/3DAYS TD PT72
1.0000 | MEDICATED_PATCH | Freq: Once | TRANSDERMAL | Status: DC
  Administered 2013-05-04: 1.5 mg via TRANSDERMAL

## 2013-05-04 MED ORDER — SIMETHICONE 80 MG PO CHEW: 80.0000 mg | CHEWABLE_TABLET | ORAL | Status: DC | PRN

## 2013-05-04 MED ORDER — FENTANYL CITRATE 0.05 MG/ML IJ SOLN
25.0000 ug | INTRAMUSCULAR | Status: DC | PRN
  Administered 2013-05-04 (×3): 50 ug via INTRAVENOUS

## 2013-05-04 MED ORDER — KETOROLAC TROMETHAMINE 30 MG/ML IJ SOLN
30.0000 mg | Freq: Four times a day (QID) | INTRAMUSCULAR | Status: DC | PRN
  Administered 2013-05-04: 30 mg via INTRAVENOUS
  Filled 2013-05-04: qty 1

## 2013-05-04 MED ORDER — DIPHENHYDRAMINE HCL 50 MG/ML IJ SOLN: 12.5000 mg | INTRAMUSCULAR | Status: DC | PRN

## 2013-05-04 MED ORDER — SODIUM CHLORIDE 0.9 % IJ SOLN: 3.0000 mL | INTRAMUSCULAR | Status: DC | PRN

## 2013-05-04 MED ORDER — SIMETHICONE 80 MG PO CHEW
80.0000 mg | CHEWABLE_TABLET | ORAL | Status: DC
  Administered 2013-05-05: 80 mg via ORAL
  Filled 2013-05-04: qty 1

## 2013-05-04 MED ORDER — CEFAZOLIN SODIUM-DEXTROSE 2-3 GM-% IV SOLR: 2.0000 g | INTRAVENOUS | Status: DC

## 2013-05-04 MED ORDER — NALBUPHINE SYRINGE 5 MG/0.5 ML
5.0000 mg | INJECTION | INTRAMUSCULAR | Status: DC | PRN
  Filled 2013-05-04: qty 1

## 2013-05-04 MED ORDER — KETOROLAC TROMETHAMINE 60 MG/2ML IM SOLN
60.0000 mg | Freq: Once | INTRAMUSCULAR | Status: AC | PRN
  Administered 2013-05-04: 60 mg via INTRAMUSCULAR

## 2013-05-04 MED ORDER — ZOLPIDEM TARTRATE 5 MG PO TABS: 5.0000 mg | ORAL_TABLET | Freq: Every evening | ORAL | Status: DC | PRN

## 2013-05-04 MED ORDER — SIMETHICONE 80 MG PO CHEW
80.0000 mg | CHEWABLE_TABLET | Freq: Three times a day (TID) | ORAL | Status: DC
  Administered 2013-05-04 – 2013-05-05 (×5): 80 mg via ORAL
  Filled 2013-05-04 (×5): qty 1

## 2013-05-04 MED ORDER — IBUPROFEN 800 MG PO TABS
800.0000 mg | ORAL_TABLET | Freq: Three times a day (TID) | ORAL | Status: DC
  Administered 2013-05-04 – 2013-05-05 (×4): 800 mg via ORAL
  Filled 2013-05-04 (×4): qty 1

## 2013-05-04 MED ORDER — OXYCODONE-ACETAMINOPHEN 5-325 MG PO TABS
1.0000 | ORAL_TABLET | ORAL | Status: DC | PRN
  Administered 2013-05-04 (×2): 2 via ORAL
  Administered 2013-05-04: 1 via ORAL
  Administered 2013-05-05 (×2): 2 via ORAL
  Filled 2013-05-04 (×3): qty 2
  Filled 2013-05-04: qty 1
  Filled 2013-05-04: qty 2

## 2013-05-04 MED ORDER — METOCLOPRAMIDE HCL 5 MG/ML IJ SOLN: 10.0000 mg | Freq: Three times a day (TID) | INTRAMUSCULAR | Status: DC | PRN

## 2013-05-04 MED ORDER — METOCLOPRAMIDE HCL 5 MG/ML IJ SOLN: 10.0000 mg | Freq: Once | INTRAMUSCULAR | Status: DC | PRN

## 2013-05-04 MED ORDER — WITCH HAZEL-GLYCERIN EX PADS: 1.0000 "application " | MEDICATED_PAD | CUTANEOUS | Status: DC | PRN

## 2013-05-04 MED ORDER — OXYTOCIN 40 UNITS IN LACTATED RINGERS INFUSION - SIMPLE MED: 62.5000 mL/h | INTRAVENOUS | Status: DC

## 2013-05-04 MED ORDER — LANOLIN HYDROUS EX OINT
1.0000 | TOPICAL_OINTMENT | CUTANEOUS | Status: DC | PRN
Start: 2013-05-04 — End: 2013-05-05

## 2013-05-04 MED ORDER — NALOXONE HCL 1 MG/ML IJ SOLN
1.0000 ug/kg/h | INTRAVENOUS | Status: DC | PRN
  Filled 2013-05-04: qty 2

## 2013-05-04 MED ORDER — IBUPROFEN 600 MG PO TABS: 600.0000 mg | ORAL_TABLET | Freq: Four times a day (QID) | ORAL | Status: DC

## 2013-05-04 MED ORDER — ONDANSETRON HCL 4 MG/2ML IJ SOLN: 4.0000 mg | INTRAMUSCULAR | Status: DC | PRN

## 2013-05-04 MED ORDER — KETOROLAC TROMETHAMINE 30 MG/ML IJ SOLN: 30.0000 mg | Freq: Four times a day (QID) | INTRAMUSCULAR | Status: DC | PRN

## 2013-05-04 MED ORDER — PRENATAL MULTIVITAMIN CH
1.0000 | ORAL_TABLET | Freq: Every day | ORAL | Status: DC
  Administered 2013-05-04 – 2013-05-05 (×2): 1 via ORAL
  Filled 2013-05-04 (×2): qty 1

## 2013-05-04 MED ORDER — DIPHENHYDRAMINE HCL 50 MG/ML IJ SOLN: 25.0000 mg | INTRAMUSCULAR | Status: DC | PRN

## 2013-05-04 MED ORDER — ONDANSETRON HCL 4 MG/2ML IJ SOLN: 4.0000 mg | Freq: Three times a day (TID) | INTRAMUSCULAR | Status: DC | PRN

## 2013-05-04 MED ORDER — SENNOSIDES-DOCUSATE SODIUM 8.6-50 MG PO TABS
2.0000 | ORAL_TABLET | ORAL | Status: DC
  Administered 2013-05-05: 2 via ORAL
  Filled 2013-05-04: qty 2

## 2013-05-04 MED ORDER — DIPHENHYDRAMINE HCL 25 MG PO CAPS: 25.0000 mg | ORAL_CAPSULE | ORAL | Status: DC | PRN

## 2013-05-04 MED ORDER — KETOROLAC TROMETHAMINE 60 MG/2ML IM SOLN
INTRAMUSCULAR | Status: AC
  Filled 2013-05-04: qty 2

## 2013-05-04 MED ORDER — NALBUPHINE SYRINGE 5 MG/0.5 ML
5.0000 mg | INJECTION | INTRAMUSCULAR | Status: DC | PRN
  Administered 2013-05-04: 5 mg via INTRAVENOUS
  Filled 2013-05-04 (×2): qty 1

## 2013-05-04 NOTE — Transfer of Care (Signed)
Immediate Anesthesia Transfer of Care Note  Patient: Angela KelchVeronica Y Gill  Procedure(s) Performed: Procedure(s): CESAREAN SECTION (N/A)  Patient Location: PACU  Anesthesia Type:Spinal  Level of Consciousness: awake, alert  and oriented  Airway & Oxygen Therapy: Patient Spontanous Breathing  Post-op Assessment: Report given to PACU RN and Post -op Vital signs reviewed and stable  Post vital signs: Reviewed and stable  Complications: No apparent anesthesia complications

## 2013-05-04 NOTE — Progress Notes (Signed)
POSTOPERATIVE DAY # 1 S/P cesarean section for intolerance to labor   S:         Reports feeling pain this am - unable to move due to pain             Tolerating po intake / no nausea / no vomiting / no flatus / no BM             Bleeding is spotting             Pain controlled with IV narcotic meds / long-acting narcotics             Not out of bed yet - no attempt to ambulate due to pain  Newborn breast feeding    O:  VS: BP 113/65  Pulse 77  Temp(Src) 98.5 F (36.9 C) (Oral)  Resp 18  Ht 5\' 6"  (1.676 m)  Wt 93.441 kg (206 lb)  BMI 33.27 kg/m2  SpO2 98%  LMP 04/16/2012   LABS:               Recent Labs  05/03/13 0835 05/04/13 0549  WBC 10.8* 15.5*  HGB 11.5* 9.2*  PLT 216 174               Bloodtype: --/--/A POS, A POS (01/09 0835)  Rubella: Equivocal (06/23 0000)                                             I&O: Intake/Output     01/09 0701 - 01/10 0700 01/10 0701 - 01/11 0700   P.O. 720    I.V. (mL/kg) 2525 (27)    Total Intake(mL/kg) 3245 (34.7)    Urine (mL/kg/hr) 1950    Blood 600    Total Output 2550     Net +695                       Physical Exam:             Alert and Oriented X3 / NAD or pain - talking with family at bedside  Lungs: Clear and unlabored  Heart: regular rate and rhythm / no mumurs  Abdomen: soft, non-tender, mildly distended, hypoactive BS             Fundus: firm, non-tender, Ueven             Dressing intact honeycomb              Incision:  approximated with sutures / no erythema / no ecchymosis / no drainage  Lochia: light  Extremities: trace edema, no calf pain or tenderness, SCD in place  A:        POD # 1 S/P CS            ABL anemia           Uncontrolled pain - no ambulation at 10 hrs post-op  P:        Routine postoperative care              Adjust analgesia scheduled regimine with PRN dosing - begin OOB and ambulation             May also try Kpad to abdomen             Iron supplement after ambulation - likely  tomorrow  Marlinda Mike CNM, MSN, [facility] 05/04/2013, 9:56 AM

## 2013-05-04 NOTE — Progress Notes (Signed)
Attempt to have patient dangle at bedside since she stated her pain was only a 2 right now but patient states her pain will get up to a 9 if she has to move and she wants to wait until she can get more pain medication at 7 am to move around.

## 2013-05-04 NOTE — Lactation Note (Signed)
This note was copied from the chart of Angela Gill Bostic. Lactation Consultation Note  Patient Name: Angela Gill Narvaiz ZOXWR'UToday's Date: 05/04/2013 Reason for consult: Initial assessment Mom reports baby is not sustaining a latch so she is supplementing. She has decided to pump and bottle feed. Offered assistance with latching baby, Mom agree but baby had recently had formula and would not latch. Set up DEBP for Mom with instructions to pump every 3 hours for 15 minutes on preemie setting. Advised if she wants baby at the breast to continue to latch before giving supplements as baby may learn to latch. Lactation brochure left for review, advised of OP services and support group. Advised to call if would like assist.   Maternal Data Formula Feeding for Exclusion: Yes Reason for exclusion: Mother's choice to formula and breast feed on admission Infant to breast within first hour of birth: No Breastfeeding delayed due to:: Maternal status Has patient been taught Hand Expression?: Yes Does the patient have breastfeeding experience prior to this delivery?: No  Feeding Feeding Type: Bottle Fed - Formula Nipple Type: Slow - flow  LATCH Score/Interventions                      Lactation Tools Discussed/Used Tools: Pump Pump Review: Setup, frequency, and cleaning;Milk Storage Initiated by:: KG Date initiated:: 05/04/13   Consult Status Consult Status: Follow-up Date: 05/05/13 Follow-up type: In-patient    Alfred LevinsGranger, Telvin Reinders Ann 05/04/2013, 9:52 PM

## 2013-05-04 NOTE — Anesthesia Postprocedure Evaluation (Signed)
  Anesthesia Post-op Note  Patient: Angela Gill  Procedure(s) Performed: Procedure(s): CESAREAN SECTION (N/A)  Patient Location: Mother/Baby  Anesthesia Type:Spinal  Level of Consciousness: awake, alert  and oriented  Airway and Oxygen Therapy: Patient Spontanous Breathing  Post-op Pain: mild  Post-op Assessment: Post-op Vital signs reviewed, Patient's Cardiovascular Status Stable, Respiratory Function Stable, No headache, No backache, No residual numbness and No residual motor weakness  Post-op Vital Signs: Reviewed and stable  Complications: No apparent anesthesia complications

## 2013-05-04 NOTE — Anesthesia Postprocedure Evaluation (Signed)
  Anesthesia Post-op Note  Anesthesia Post Note  Patient: Angela KelchVeronica Y Gill  Procedure(s) Performed: Procedure(s) (LRB): CESAREAN SECTION (N/A)  Anesthesia type: Spinal  Patient location: PACU  Post pain: Pain level controlled  Post assessment: Post-op Vital signs reviewed  Post vital signs: Reviewed  Level of consciousness: awake  Complications: No apparent anesthesia complications

## 2013-05-05 MED ORDER — TRAMADOL HCL 50 MG PO TABS: 50.0000 mg | ORAL_TABLET | Freq: Four times a day (QID) | ORAL | Status: DC

## 2013-05-05 MED ORDER — OXYCODONE-ACETAMINOPHEN 5-325 MG PO TABS: 1.0000 | ORAL_TABLET | ORAL | Status: DC | PRN

## 2013-05-05 MED ORDER — TRAMADOL HCL 50 MG PO TABS: 50.0000 mg | ORAL_TABLET | Freq: Four times a day (QID) | ORAL | Status: AC

## 2013-05-05 MED ORDER — OXYCODONE-ACETAMINOPHEN 5-325 MG PO TABS: 1.0000 | ORAL_TABLET | ORAL | Status: AC | PRN

## 2013-05-05 MED ORDER — IBUPROFEN 800 MG PO TABS: 800.0000 mg | ORAL_TABLET | Freq: Three times a day (TID) | ORAL | Status: AC

## 2013-05-05 NOTE — Discharge Summary (Signed)
POSTOPERATIVE DISCHARGE SUMMARY:  Patient ID: Angela Gill MRN: [id number] DOB/AGE: [birth year] 33 y.o.  Admit date: 05/03/2013 Admission Diagnoses: post-dates at 41 weeks / elective IOL  Discharge date:   Discharge Diagnoses: POD 2 s/p primary cesarean section - fetal intolerance to labor / mild ABL anemia  Prenatal history: G1P0   EDC : 04/27/2013, by Other Basis  Prenatal care at [facility]  Primary provider : Mody Prenatal course complicated by infertility / excessive maternal weight gain / post-dates / + GBS  Prenatal Labs: ABO, Rh: --/--/A POS, A POS (01/09 9826)  Antibody: NEG (01/09 0835) Rubella: Equivocal (06/23 0000)  / MMR booster offered to patient RPR: NON REACTIVE (01/09 0835)  HBsAg: Negative (06/23 0000)  HIV: Non-reactive (06/23 0000)  GTT : NL GBS: Positive (06/23 0000)   Medical / Surgical History :  Past medical history:  Past Medical History  Diagnosis Date  . Hx of varicella   . Infertility, female   . Constipation   . Rhinitis   . Croup   . S/P cesarean section 05/03/2013    Past surgical history:  Past Surgical History  Procedure Laterality Date  . Wisdom tooth extraction      Family History:  Family History  Problem Relation Age of Onset  . Hypothyroidism Mother   . Hypothyroidism Sister     Social History:  reports that she has never smoked. She has never used smokeless tobacco. She reports that she drinks alcohol. She reports that she does not use illicit drugs.  Allergies: Lactose intolerance (gi)   Current Medications at time of admission:  Prior to Admission medications   Medication Sig Start Date End Date Taking? Authorizing Provider  IRON PO Take 1 tablet by mouth daily.    Yes Historical Provider, MD  Prenatal Vit-Fe Fumarate-FA (PRENATAL MULTIVITAMIN) TABS tablet Take 1 tablet by mouth daily at 12 noon.   Yes Historical Provider, MD     Intrapartum Course:  Admit for induction of labor with no labor  progression - fetal decelerations and maternal exhaustion Complicated by: repetitive FHR decelerations with any attempt to increase uterine ctx activity Interventions required: cesarean delivery   Procedures: Cesarean section delivery on 1/9 with delivery of  female newborn by Dr Benjie Karvonen   See operative report for further details APGAR (1 MIN): 9   APGAR (5 MINS): 9    Postoperative / postpartum course:  Uncomplicated with discharge on POD 2  Physical Exam:   VSS: Temp:  [97.8 F (36.6 C)-98.4 F (36.9 C)] 98.4 F (36.9 C) (01/11 0535) Pulse Rate:  [74-93] 79 (01/11 0535) Resp:  [16-18] 18 (01/11 0535) BP: (103-110)/(61-67) 103/61 mmHg (01/11 0535) SpO2:  [98 %] 98 % (01/10 2215)  LABS:  Recent Labs  05/03/13 0835 05/04/13 0549  WBC 10.8* 15.5*  HGB 11.5* 9.2*  PLT 216 174   General: pleasant / ambulatory Heart: RRR Lungs: clear  Abdomen: soft and non-tender / non-distended / active BS  Extremities: no  edema / negtaive Homans  Dressing: intact honeycomb dressing Incision:  approximated with sutures / no erythema / no ecchymosis / no drainage  Discharge Instructions:  Discharged Condition: stable  Activity: pelvic rest and postoperative restrictions x 2   Diet: routine  Medications:    Medication List         ibuprofen 800 MG tablet  Commonly known as:  ADVIL,MOTRIN  Take 1 tablet (800 mg total) by mouth every 8 (eight) hours.  IRON PO  Take 1 tablet by mouth daily.     oxyCODONE-acetaminophen 5-325 MG per tablet  Commonly known as:  PERCOCET/ROXICET  Take 1 tablet by mouth every 4 (four) hours as needed for severe pain (severe pain not relieved by Ultram and Motrin).     prenatal multivitamin Tabs tablet  Take 1 tablet by mouth daily at 12 noon.     traMADol 50 MG tablet  Commonly known as:  ULTRAM  Take 1 tablet (50 mg total) by mouth every 6 (six) hours.        Wound Care: keep clean and dry / remove honeycomb POD 5 Postpartum  Instructions: [facility] discharge booklet - instructions reviewed  Discharge to: Home  Follow up :  [facility] in 6 weeks for routine postpartum visit with Dr Benjie Karvonen                Signed: Artelia Laroche CNM, MSN, [facility] 05/05/2013, 1:09 PM

## 2013-05-05 NOTE — Progress Notes (Signed)
POSTOPERATIVE DAY # 2 S/P CS   S:         Reports feeling better today - still sore but better / wants early DC this PM             Tolerating po intake / no nausea / no vomiting / + flatus / no BM             Bleeding is light             Pain controlled with motrin and percocet - desires change in med as percocet only makes her sleepy and doesnot resolve pain any better than Motrin             Up ad lib / ambulatory/ voiding QS  Newborn breast feeding  / Circumcision today   O:  VS: BP 103/61  Pulse 79  Temp(Src) 98.4 F (36.9 C) (Oral)  Resp 18  Ht 5\' 6"  (1.676 m)  Wt 93.441 kg (206 lb)  BMI 33.27 kg/m2  SpO2 98%  LMP 04/16/2012   LABS:               Recent Labs  05/03/13 0835 05/04/13 0549  WBC 10.8* 15.5*  HGB 11.5* 9.2*  PLT 216 174               Bloodtype: --/--/A POS, A POS (01/09 0835)  Rubella: Equivocal (06/23 0000)                                             I&O: Intake/Output     01/10 0701 - 01/11 0700 01/11 0701 - 01/12 0700   P.O.     I.V. (mL/kg) 250 (2.7)    Total Intake(mL/kg) 250 (2.7)    Urine (mL/kg/hr) 3800 (1.7)    Blood     Total Output 3800     Net -3550                       Physical Exam:             Alert and Oriented X3  Lungs: Clear and unlabored  Heart: regular rate and rhythm / no mumurs  Abdomen: soft, non-tender, non-distended active BS             Fundus: firm, non-tender, U-1             Dressing intact honeycomb              Incision:  approximated with sutures / no erythema / no ecchymosis / no drainage  Perineum: intact  Lochia: light   Extremities: no edema, no calf pain or tenderness, negative Homans  A:        POD # 2 S/P CS            Mild ABL anemia  P:        Routine postoperative care              Change analgesia -                     add Ultram scheduled x 3 days then PRN may use Percocet prn pain in addition to Motrin and Ultram.             DC home if pain well controlled and circ  completed  Marlinda MikeBAILEY, Jace Dowe CNM, MSN, FACNM 05/05/2013, 1:03 PM

## 2013-05-05 NOTE — Lactation Note (Addendum)
This note was copied from the chart of Angela Gill. Lactation Consultation Note  Patient Name: Angela Gill NWGNF'AToday's Date: 05/05/2013 Reason for consult: Follow-up assessment Mom is pump and bottle feeding, Mom reports that she is pumping consistently and plans to purchase a DEBP. Mom denies any questions or concerns. Engorgement care reviewed if needed. Advised of OP services and support group. Mom given hand pump to use for home till she gets DEBP. Demonstrated use.   Maternal Data    Feeding Feeding Type: Bottle Fed - Formula  LATCH Score/Interventions                      Lactation Tools Discussed/Used Tools: Pump Breast pump type: Double-Electric Breast Pump   Consult Status Consult Status: Complete Date: 05/05/13 Follow-up type: In-patient    Angela LevinsGranger, Angela Gill 05/05/2013, 3:58 PM

## 2013-05-06 ENCOUNTER — Encounter (HOSPITAL_COMMUNITY): Payer: Self-pay | Admitting: Obstetrics & Gynecology

## 2013-05-06 NOTE — Discharge Summary (Signed)
Reviewed and agree with note and plan. V.Viviann Broyles, MD  

## 2013-05-09 ENCOUNTER — Encounter (HOSPITAL_COMMUNITY): Payer: Self-pay

## 2013-07-10 IMAGING — CR DG CHEST 2V
2 series · 2 of 2 positions shown · non-contrast
Comparison: No priors.

CLINICAL DATA: Chest pain.

CHEST - 2 VIEW

[w chest pa]
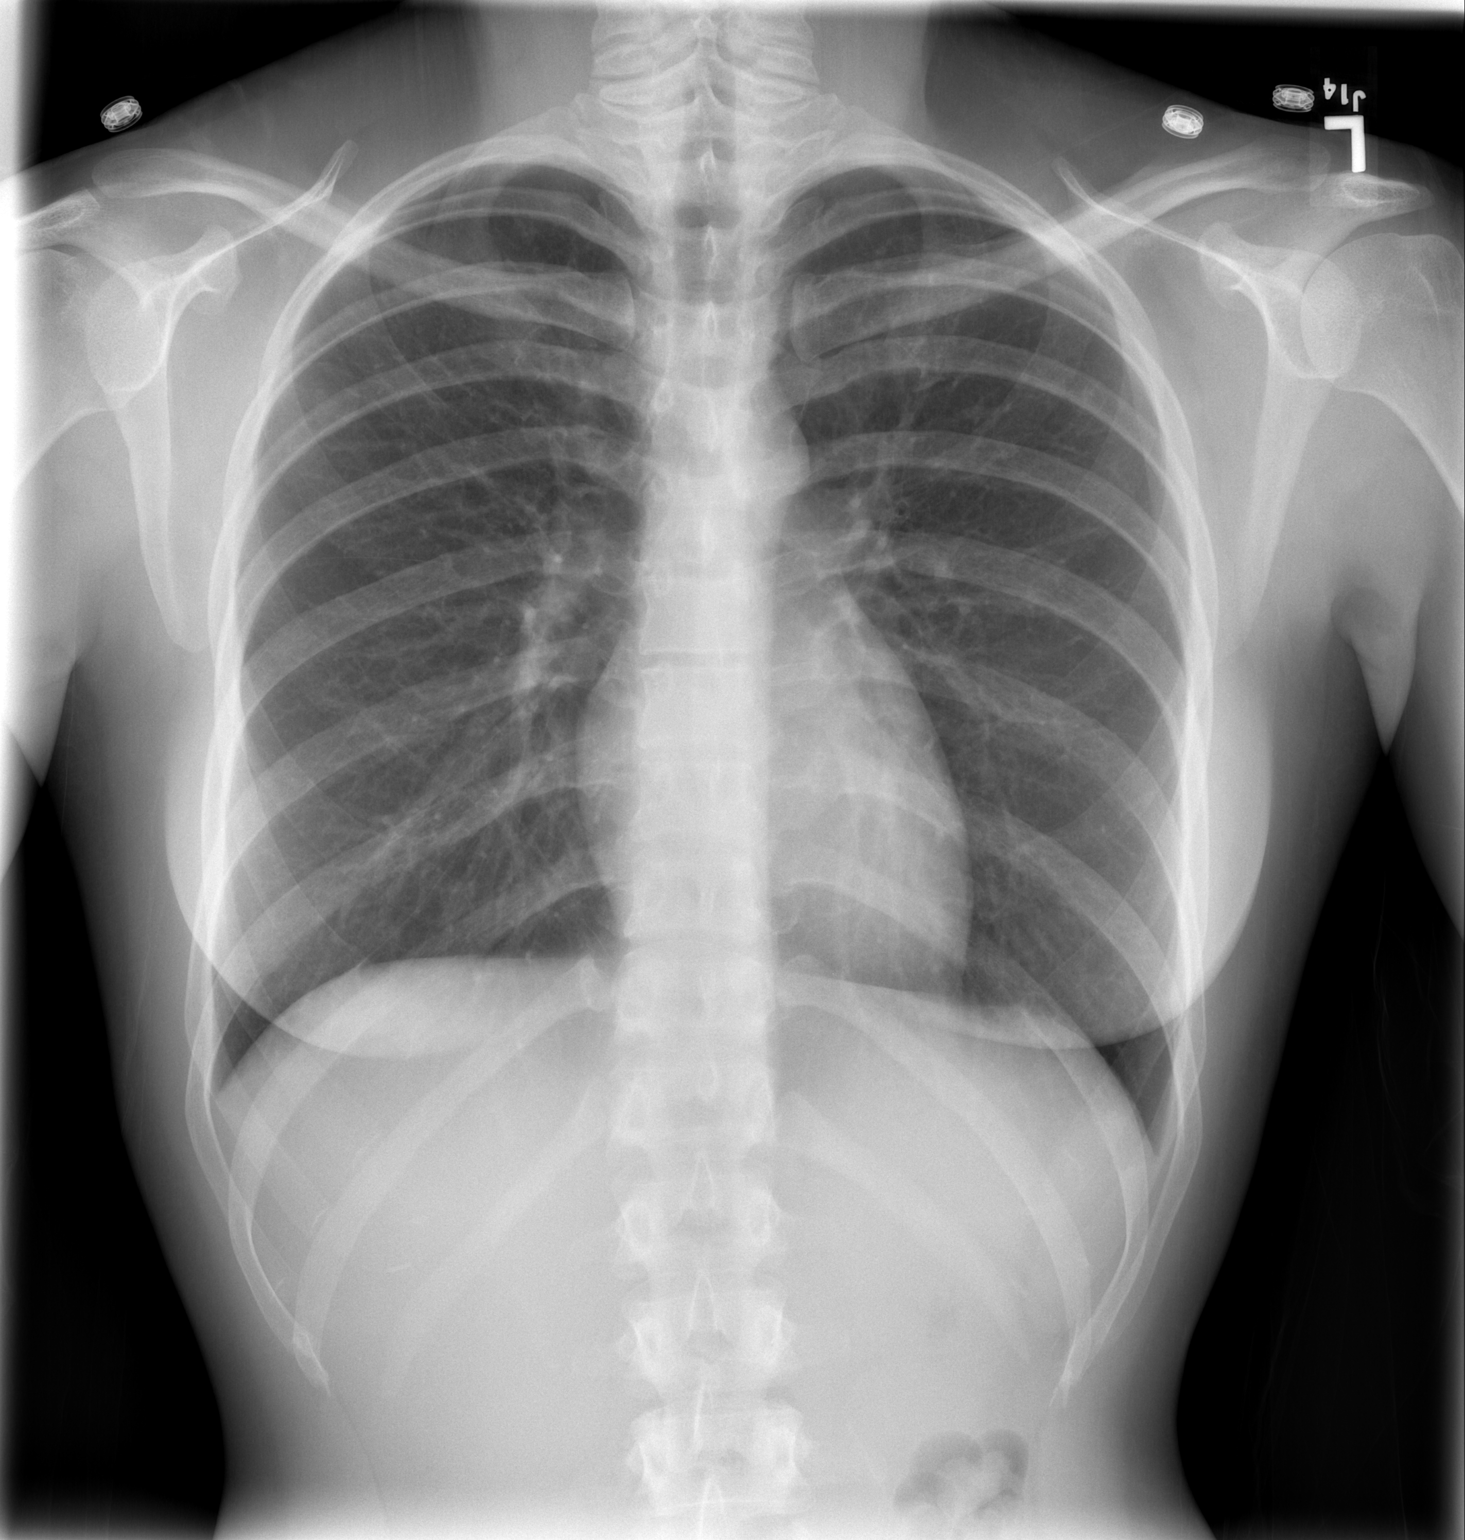

[w chest lat]
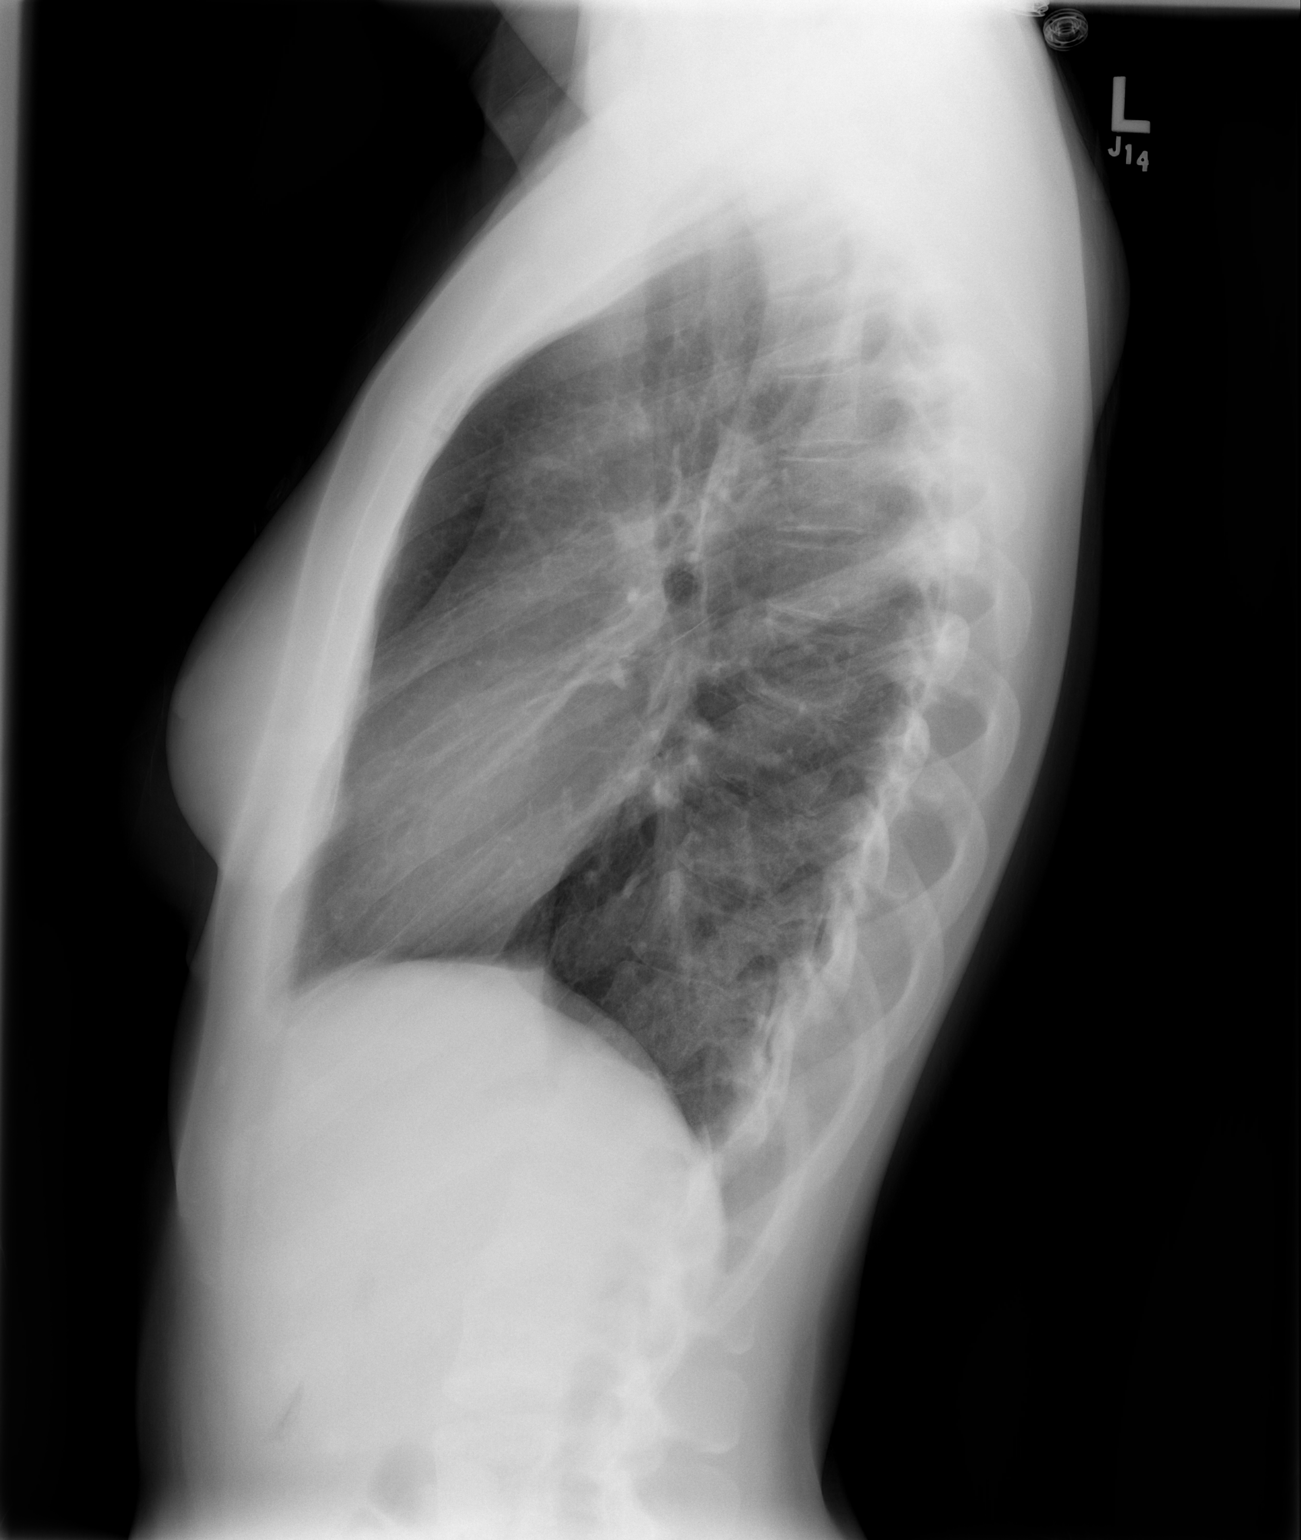

[2 of 2 positions shown; findings below may reference images not displayed]

FINDINGS: Lung volumes are normal.  No consolidative airspace
disease.  No pleural effusions.  No pneumothorax.  No pulmonary
nodule or mass noted.  Pulmonary vasculature and the
cardiomediastinal silhouette are within normal limits.
IMPRESSION: 1. No radiographic evidence of acute cardiopulmonary disease.

## 2014-02-18 ENCOUNTER — Encounter: Payer: Self-pay | Admitting: Medical

## 2014-02-18 ENCOUNTER — Ambulatory Visit (INDEPENDENT_AMBULATORY_CARE_PROVIDER_SITE_OTHER): Payer: BC Managed Care – PPO | Admitting: Medical

## 2014-02-18 VITALS — BP 110/74 | HR 80 | Temp 99.3°F | Ht 64.75 in | Wt 169.6 lb

## 2014-02-18 DIAGNOSIS — J208 Acute bronchitis due to other specified organisms: Secondary | ICD-10-CM

## 2014-02-18 DIAGNOSIS — R059 Cough, unspecified: Secondary | ICD-10-CM | POA: Insufficient documentation

## 2014-02-18 DIAGNOSIS — R05 Cough: Secondary | ICD-10-CM

## 2014-02-18 DIAGNOSIS — J209 Acute bronchitis, unspecified: Secondary | ICD-10-CM | POA: Insufficient documentation

## 2014-02-18 DIAGNOSIS — R062 Wheezing: Secondary | ICD-10-CM | POA: Insufficient documentation

## 2014-02-18 MED ORDER — AZITHROMYCIN 250 MG PO TABS: ORAL_TABLET | ORAL | Status: AC

## 2014-02-18 MED ORDER — HYDROCODONE-HOMATROPINE 5-1.5 MG/5ML PO SYRP: 5.0000 mL | ORAL_SOLUTION | Freq: Four times a day (QID) | ORAL | Status: AC | PRN

## 2014-02-18 MED ORDER — ALBUTEROL SULFATE HFA 108 (90 BASE) MCG/ACT IN AERS: 2.0000 | INHALATION_SPRAY | Freq: Four times a day (QID) | RESPIRATORY_TRACT | Status: AC | PRN

## 2014-02-18 NOTE — Assessment & Plan Note (Signed)
Keeping her up at night so I did rx hydrocodone based cough syrup.

## 2014-02-18 NOTE — Patient Instructions (Signed)
For your cough, I am prescribing hydromet syrup. For likely bronchitis, I am prescribing azithromycin. If your wheeze more, I am making albuterol inhaler available.  Follow up in 7 days or as needed.

## 2014-02-18 NOTE — Assessment & Plan Note (Signed)
Pt may have bronchitis. In addition to way all of her coworkers are coughing makes me want to provide coverage for pertussis. So did rx azithromycin today.

## 2014-02-18 NOTE — Progress Notes (Signed)
Subjective:    Patient ID: Angela KelchVeronica Y Gill, female    DOB: March 23, 1981, 33 y.o.   MRN: [id number]  HPI  Pt has cough, nasal congestion, and productive cough since Friday. A lot of coworkers have the same. Very little sleep due to the cough. Pt thought maybe had some wheezing yesterday. But hx of asthma. Non smoker. Lmp- 2 wks ago.  Pt has some recent feeling hot with some sweats at night.  Past Medical History  Diagnosis Date  . Hx of varicella   . Infertility, female   . Constipation   . Rhinitis   . Croup   . S/P cesarean section 05/03/2013    History   Social History  . Marital Status: Married    Spouse Name: N/A    Number of Children: N/A  . Years of Education: N/A   Occupational History  . Not on file.   Social History Main Topics  . Smoking status: Never Smoker   . Smokeless tobacco: Never Used  . Alcohol Use: Yes     Comment: occasionally  . Drug Use: No  . Sexual Activity: Not on file   Other Topics Concern  . Not on file   Social History Narrative  . No narrative on file    Past Surgical History  Procedure Laterality Date  . Wisdom tooth extraction    . Cesarean section N/A 05/03/2013    Procedure: CESAREAN SECTION;  Surgeon: Robley FriesVaishali R Mody, MD;  Location: [facility];  Service: Obstetrics;  Laterality: N/A;    Family History  Problem Relation Age of Onset  . Hypothyroidism Mother   . Hypothyroidism Sister     Allergies  Allergen Reactions  . Lactose Intolerance (Gi) Other (See Comments)    Gi upset    Current Outpatient Prescriptions on File Prior to Visit  Medication Sig Dispense Refill  . ibuprofen (ADVIL,MOTRIN) 800 MG tablet Take 1 tablet (800 mg total) by mouth every 8 (eight) hours.  30 tablet  0  . IRON PO Take 1 tablet by mouth daily.       Marland Kitchen. oxyCODONE-acetaminophen (PERCOCET/ROXICET) 5-325 MG per tablet Take 1 tablet by mouth every 4 (four) hours as needed for severe pain (severe pain not relieved by Ultram and Motrin).  30 tablet  0    . Prenatal Vit-Fe Fumarate-FA (PRENATAL MULTIVITAMIN) TABS tablet Take 1 tablet by mouth daily at 12 noon.      . traMADol (ULTRAM) 50 MG tablet Take 1 tablet (50 mg total) by mouth every 6 (six) hours.  30 tablet  0   No current facility-administered medications on file prior to visit.    BP 110/74  Pulse 80  Temp(Src) 99.3 F (37.4 C) (Oral)  Ht 5' 4.75" (1.645 m)  Wt 169 lb 9.6 oz (76.93 kg)  BMI 28.43 kg/m2  SpO2 99%  LMP 02/04/2014      Review of Systems  Constitutional: Positive for fever and chills. Negative for fatigue.       Maybe fever since some sweating at night.  Respiratory: Positive for cough and wheezing. Negative for chest tightness and shortness of breath.        Mild wheezing yesterday.  Cardiovascular: Negative for chest pain and palpitations.  Gastrointestinal: Negative.   Genitourinary: Negative.   Musculoskeletal: Negative for back pain.  Neurological: Negative.   Hematological: Negative for adenopathy. Does not bruise/bleed easily.       Objective:   Physical Exam  Constitutional: She is oriented to  person, place, and time. She appears well-developed and well-nourished. No distress.  HENT:  Head: Normocephalic and atraumatic.  Right Ear: External ear normal.  Left Ear: External ear normal.  Nose: Nose normal.  Mouth/Throat: Oropharynx is clear and moist. No oropharyngeal exudate.  Mild boggy turbinates. Some pnd. No frontal or maxillary sinus pressure.  Eyes: Conjunctivae and EOM are normal. Pupils are equal, round, and reactive to light. Right eye exhibits no discharge. Left eye exhibits no discharge.  Neck: Normal range of motion. Neck supple. No JVD present. No tracheal deviation present. No thyromegaly present.  Cardiovascular: Normal rate, regular rhythm and normal heart sounds.  Exam reveals no gallop and no friction rub.   No murmur heard. Pulmonary/Chest: Effort normal and breath sounds normal. No stridor. No respiratory distress.  She has no wheezes. She has no rales. She exhibits no tenderness.  Abdominal: Soft. Bowel sounds are normal. She exhibits no distension and no mass. There is no tenderness. There is no rebound and no guarding.  Musculoskeletal: She exhibits no edema and no tenderness.  Lymphadenopathy:    She has no cervical adenopathy.  Neurological: She is alert and oriented to person, place, and time. No cranial nerve deficit. Coordination normal.  Skin: Skin is warm and dry.  Psychiatric: She has a normal mood and affect. Her behavior is normal. Judgment and thought content normal.          Assessment & Plan:  On ros. She does have some nasal congestion as well.

## 2014-02-18 NOTE — Progress Notes (Signed)
Pre visit review using our clinic review tool, if applicable. No additional management support is needed unless otherwise documented below in the visit note. 

## 2014-02-18 NOTE — Assessment & Plan Note (Signed)
Some last night so I did make albuterol available in case wheezing persists or worsens.

## 2014-02-24 ENCOUNTER — Encounter: Payer: Self-pay | Admitting: Medical

## 2014-02-28 ENCOUNTER — Ambulatory Visit (INDEPENDENT_AMBULATORY_CARE_PROVIDER_SITE_OTHER): Payer: BC Managed Care – PPO | Admitting: Family Medicine

## 2014-02-28 ENCOUNTER — Encounter: Payer: Self-pay | Admitting: Family Medicine

## 2014-02-28 VITALS — BP 122/80 | HR 88 | Temp 98.1°F | Resp 17 | Wt 171.2 lb

## 2014-02-28 DIAGNOSIS — R059 Cough, unspecified: Secondary | ICD-10-CM

## 2014-02-28 DIAGNOSIS — R05 Cough: Secondary | ICD-10-CM

## 2014-02-28 MED ORDER — PROMETHAZINE-DM 6.25-15 MG/5ML PO SYRP: 5.0000 mL | ORAL_SOLUTION | Freq: Four times a day (QID) | ORAL | Status: AC | PRN

## 2014-02-28 MED ORDER — PREDNISONE 10 MG PO TABS: ORAL_TABLET | ORAL | Status: AC

## 2014-02-28 MED ORDER — IPRATROPIUM-ALBUTEROL 0.5-2.5 (3) MG/3ML IN SOLN
3.0000 mL | Freq: Once | RESPIRATORY_TRACT | Status: AC
  Administered 2014-02-28: 3 mL via RESPIRATORY_TRACT

## 2014-02-28 NOTE — Assessment & Plan Note (Signed)
New to provider.  Ongoing for pt.  Cough is more consistent w/ post-infectious/RAD cough.  No need for additional abx.  Air movement improved s/p duoneb.  Start pred taper, cough meds, inhaler prn.  Reviewed supportive care and red flags that should prompt return.  Pt expressed understanding and is in agreement w/ plan.

## 2014-02-28 NOTE — Progress Notes (Signed)
Pre visit review using our clinic review tool, if applicable. No additional management support is needed unless otherwise documented below in the visit note. 

## 2014-02-28 NOTE — Progress Notes (Signed)
   Subjective:    Patient ID: Angela KelchVeronica Y Gill, female    DOB: Dec 19, 1980, 33 y.o.   MRN: [id number]  HPI URI- sxs started 3 weeks ago.  Was treated w/ Zpack and hycodan on 10/27 but sxs didn't improve.  Still having cough, runny nose, dry mouth.  'my chest is burning'.  No fevers.  + sick contacts.  Cough is intermittently productive.  Not currently on seasonal allergy medications.  Not using inhaler.   Review of Systems For ROS see HPI     Objective:   Physical Exam  Constitutional: She appears well-developed and well-nourished. No distress.  HENT:  Head: Normocephalic and atraumatic.  Right Ear: Tympanic membrane normal.  Left Ear: Tympanic membrane normal.  Nose: Mucosal edema and rhinorrhea present. Right sinus exhibits no maxillary sinus tenderness and no frontal sinus tenderness. Left sinus exhibits no maxillary sinus tenderness and no frontal sinus tenderness.  Mouth/Throat: Mucous membranes are normal. Posterior oropharyngeal erythema (w/ PND) present.  Eyes: Conjunctivae and EOM are normal. Pupils are equal, round, and reactive to light.  Neck: Normal range of motion. Neck supple.  Cardiovascular: Normal rate, regular rhythm and normal heart sounds.   Pulmonary/Chest: Effort normal and breath sounds normal. No respiratory distress. She has no wheezes. She has no rales.  Lymphadenopathy:    She has no cervical adenopathy.  Vitals reviewed.         Assessment & Plan:

## 2014-02-28 NOTE — Patient Instructions (Signed)
Follow up as needed Start the prednisone as directed for airway inflammation Mucinex DM for daytime cough Cough syrup for nights/weekends- will cause drowsiness Drink plenty of fluids! Start Claritin or Zyrtec daily for the allergy component When you develop a coughing fit, use the albuterol inhaler, 2 puffs, to break the airway spasm Call with any questions or concerns Hang in there!
